# Patient Record
Sex: Female | Born: 1970 | Race: White | Hispanic: No | Marital: Married | Smoking: Former smoker
Health system: Southern US, Community
[De-identification: ages and names within clinical notes are randomized; demographics above are authoritative.]

## PROBLEM LIST (undated history)

## (undated) DIAGNOSIS — O1490 Unspecified pre-eclampsia, unspecified trimester: Secondary | ICD-10-CM

## (undated) DIAGNOSIS — K219 Gastro-esophageal reflux disease without esophagitis: Secondary | ICD-10-CM

## (undated) DIAGNOSIS — F53 Postpartum depression: Secondary | ICD-10-CM

## (undated) DIAGNOSIS — O09299 Supervision of pregnancy with other poor reproductive or obstetric history, unspecified trimester: Secondary | ICD-10-CM

## (undated) DIAGNOSIS — I10 Essential (primary) hypertension: Secondary | ICD-10-CM

## (undated) DIAGNOSIS — IMO0002 Reserved for concepts with insufficient information to code with codable children: Secondary | ICD-10-CM

## (undated) DIAGNOSIS — F99 Mental disorder, not otherwise specified: Secondary | ICD-10-CM

## (undated) DIAGNOSIS — D649 Anemia, unspecified: Secondary | ICD-10-CM

## (undated) DIAGNOSIS — Z331 Pregnant state, incidental: Secondary | ICD-10-CM

## (undated) HISTORY — PX: NO PAST SURGERIES: SHX2092

## (undated) HISTORY — DX: Anemia, unspecified: D64.9

---

## 2010-10-12 NOTE — L&D Delivery Note (Signed)
Delivery Information for [facility]    Labor Events:    Preterm labor: Yes   Cervical ripening:                    Rupture date: 08/29/2011   Rupture time: 8:30 AM   Rupture type: Spontaneous   Fluid Color: Clear   Fluid Odor Normal       Risk Factors:                                     LOW PAPP-A       Delivery Complications:  Abruption                                   during delivery          Vaginal Delivery Counts    Initial Count Correct?:  Yes   Next Count Correct?:      Relief Count Correct?:      Other Count Correct?:     Final Count Correct?:  Yes       Presentation/Position     Vertex   Left   Occiput   Anterior          Delivery (Newborn)    FHR in O.R.:     Delivery Rm Temp:     Date of birth: 08/29/2010   Time of birth: 10:18 PM   Sex: female   GA: Gestational Age: <None>   Delivery Type: Vaginal, Spontaneous Delivery                        VBAC Attempted:No    VBAC Successful:No   Delivery Assist by:     Number of pulls:     Vacuum Type:     Forceps Type:        Delivery Maneuvers    Non-Dystocia Maneuver:                          :         Delivery (Maternal)    Episiotomy: None   Lacerations: Yes   Laceration Type: 1st    Episiotomy/Laceration Repair: None   Blood Loss (mL): 300   Uterus Explored: No   Anesthesia Method: Local;Epidural            Newborn Assessment    Living at birth: Yes          APGARS  One minute Five minutes Ten minutes   Skin color: 0   1       Heart rate: 2   2       Grimace: 2   2       Muscle tone: 1   1       Breathing: 2   2       Totals: 7  8              Resuscitation    Resuscitation: Tactile Stimulation  Oxygen via mask/blow-by        Suction: Bulb  Gastric   Prior to DEL of chest: Yes       Newborn Measurements    Weight: 5 lb 11.9 oz (2605 g)   Length: 19"   Observed Anomalies:  Cord Information    Vessels: 3 vessels   Complications: Nuchal   Nuchal Cord: 1x   Cut before DEL of shoulder:     Blood gases sent? No   Cord Tissue Sample: No       Placenta  Information    Placenta Removal: Spontaneous     Placenta Appearance: Intact       Culture/Pathology    Cultures obtained: None        Pathology Specimens: Placenta

## 2011-03-05 ENCOUNTER — Ambulatory Visit: Admit: 2011-03-05 | Discharge: 2011-03-05 | Payer: Self-pay | Source: Ambulatory Visit

## 2011-06-11 ENCOUNTER — Ambulatory Visit
Admission: RE | Admit: 2011-06-11 | Disposition: A | Discharge status: Home / Self Care | Payer: Self-pay | Source: Ambulatory Visit

## 2011-07-09 ENCOUNTER — Ambulatory Visit
Admission: RE | Admit: 2011-07-09 | Disposition: A | Discharge status: Home / Self Care | Payer: Self-pay | Source: Ambulatory Visit

## 2011-07-15 NOTE — Consults (Signed)
Makayla Sullivan, Makayla Sullivan                                                    MRN:          [id number]                                                          Account:      [id number]                                                     Document ID:  [id number]                                               Service Date: 06/11/2011                                                                                    MRN: [id number]  Document ID: [id number]  Admit Date: 06/11/2011     Patient Location: DISCHARGED 06/11/2011  Patient Type: O     CONSULTING PHYSICIAN: Manuela Schwartz MD     REFERRING PHYSICIAN:         NOTE:  The [facility].     HISTORY OF PRESENT ILLNESS:   On the surface, the patient looks like she has multiple problems; however,  they do not justify the kind of anxiety she presented with today.       Her issues are as follows:  1.  Possible hypertension.  Apparently she was hypertensive and was on  Procardia-XL 30 mg a day.  She lost weight and her Procardia-XL was  discontinued by 8 weeks of her pregnancy for normal blood pressure.  Her  liver results were normal and she denies any symptoms associated with  hypertension.  At this point in her pregnancy, her blood pressures are  running normally.  She is taking them at home and today I have given her a  sheet to record her blood pressures and her weight and check her urine  protein on a daily basis for the development of preeclampsia.  She does  have an increased risk of preeclampsia and I have informed her of that and  I have told her that the best way for [place] to deal with it is to monitor it  and identify it when it happens.  She had her uterine artery Dopplers at 12  weeks and they were normal.  I am not going to repeat those now; the  initial ones have been normal.  2.  This patient is advanced maternal age; she is 40.  She had a first  trimester screen that showed her risk for Down syndrome to be in the 1 in  450, which is  equivalent to a 39 year old patient, it was increased to 1 in  350 approximately because of the presence of pyelectasis on the ultrasound  of her baby, which is still a risk of a 39 year old woman.  She was given  the option for either amniocentesis or MaterniT21 and she did that and the  MaterniT21 came back normal, which has a 99%.  This is specifically a  specificity and sensitivity.  At this point, I think we have put this issue  to rest.  3.  Her pyelectasis is stable.  It is associated with the gender of the  baby being female and at this point again, I have made it very clear to her 40  that the chances that this will progress into any significant pathology is                                                                                                           Page 1 of 2  Makayla Sullivan, Makayla Sullivan                                                    MRN:          [id number]                                                          Account:      [id number]                                                     Document ID:  [id number]                                               Service Date: 06/11/2011  very minimal and again, we need to relax about it.  If it is below 6 or 7,  it is usually of no consequence; however, it can be associated with   increasing the risk of Down syndrome.  When we adjusted for that after her  nuchal translucency the risk was still at 1 in about 350.  4.  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  This is  associated with an increased risk of growth restriction and/or premature  delivery; however, most of the patients with low  PAPP-A end up with a  normal, healthy pregnancy and I have told the patient that.     PLAN:  My plan for her is to have her come back in about 4 weeks for an EFW.   Three weeks after that, we will repeat her EFW.  At 36 weeks, we will start  her on nonstress test and AFI  on a weekly basis and I would recommend she  be delivered by 39 weeks because of the multiple issues and her maternal  age.  If the baby does not exhibit growth restriction, then the risk of  complications from the PAPP-A are going to be lower.  The patient  understands.              D:  06/11/2011 14:54 PM by Manuela Schwartz, MD [phone])  T:  06/12/2011 10:41 AM by MD      (Conf: [phone]) (Doc ID: [id number])        cc: Alver Sorrow MD                                                                                                           Page 2 of 2

## 2011-07-20 ENCOUNTER — Ambulatory Visit (HOSPITAL_BASED_OUTPATIENT_CLINIC_OR_DEPARTMENT_OTHER)
Admit: 2011-07-20 | Discharge: 2011-07-20 | Disposition: A | Discharge status: Home / Self Care | Payer: Self-pay | Source: Ambulatory Visit

## 2011-08-04 ENCOUNTER — Encounter (HOSPITAL_BASED_OUTPATIENT_CLINIC_OR_DEPARTMENT_OTHER): Payer: Self-pay | Admitting: Specialist

## 2011-08-12 LAB — BASIC METABOLIC PANEL
BUN: 7 mg/dL — ABNORMAL LOW (ref 8–20)
CO2: 21 mEq/L (ref 21–30)
Calcium: 8.8 mg/dL (ref 8.6–10.2)
Chloride: 104 mEq/L (ref 98–107)
Creatinine: 0.5 mg/dL — ABNORMAL LOW (ref 0.6–1.5)
Glucose: 82 mg/dL (ref 70–100)
Potassium: 4.3 mEq/L (ref 3.6–5.0)
Sodium: 136 mEq/L (ref 136–146)

## 2011-08-12 LAB — CBC
Hematocrit: 38.1 % (ref 37.0–47.0)
Hgb: 13.1 g/dL (ref 12.0–16.0)
MCH: 28.7 pg (ref 28.0–32.0)
MCHC: 34.4 g/dL (ref 32.0–36.0)
MCV: 83.6 fL (ref 80.0–100.0)
MPV: 12.4 fL — ABNORMAL HIGH (ref 9.4–12.3)
Nucleated RBC: 0 /100 WBC
Platelets: 244 10*3/uL (ref 140–400)
RBC: 4.56 10*6/uL (ref 4.20–5.40)
RDW: 14 % (ref 12–15)
WBC: 9.93 10*3/uL (ref 3.50–10.80)

## 2011-08-12 LAB — ALT: ALT: 30 U/L (ref 3–36)

## 2011-08-12 LAB — URIC ACID: Uric acid: 3.8 mg/dL (ref 2.3–6.1)

## 2011-08-12 LAB — AST: AST (SGOT): 25 U/L (ref 10–41)

## 2011-08-12 LAB — LACTATE DEHYDROGENASE: LDH: 555 U/L (ref 307–575)

## 2011-08-12 LAB — GFR: EGFR: >60

## 2011-08-20 ENCOUNTER — Inpatient Hospital Stay
Admission: RE | Admit: 2011-08-20 | Discharge: 2011-08-20 | Disposition: A | Discharge status: Home / Self Care | Payer: BLUE CROSS/BLUE SHIELD | Source: Ambulatory Visit | Attending: Specialist | Admitting: Specialist

## 2011-08-20 DIAGNOSIS — O09519 Supervision of elderly primigravida, unspecified trimester: Secondary | ICD-10-CM | POA: Insufficient documentation

## 2011-08-29 ENCOUNTER — Inpatient Hospital Stay (HOSPITAL_BASED_OUTPATIENT_CLINIC_OR_DEPARTMENT_OTHER): Payer: BLUE CROSS/BLUE SHIELD | Admitting: Specialist

## 2011-08-29 ENCOUNTER — Encounter (HOSPITAL_BASED_OUTPATIENT_CLINIC_OR_DEPARTMENT_OTHER): Payer: Self-pay | Admitting: Anesthesiology

## 2011-08-29 ENCOUNTER — Encounter (HOSPITAL_BASED_OUTPATIENT_CLINIC_OR_DEPARTMENT_OTHER): Payer: Self-pay | Admitting: Specialist

## 2011-08-29 ENCOUNTER — Inpatient Hospital Stay
Admission: RE | Admit: 2011-08-29 | Discharge: 2011-08-31 | DRG: 774 | Disposition: A | Discharge status: Home / Self Care | Payer: BLUE CROSS/BLUE SHIELD | Source: Ambulatory Visit | Attending: Obstetrics | Admitting: Obstetrics

## 2011-08-29 DIAGNOSIS — G4700 Insomnia, unspecified: Secondary | ICD-10-CM | POA: Diagnosis present

## 2011-08-29 DIAGNOSIS — Z23 Encounter for immunization: Secondary | ICD-10-CM

## 2011-08-29 DIAGNOSIS — O99892 Other specified diseases and conditions complicating childbirth: Secondary | ICD-10-CM | POA: Diagnosis present

## 2011-08-29 DIAGNOSIS — O1002 Pre-existing essential hypertension complicating childbirth: Secondary | ICD-10-CM | POA: Diagnosis present

## 2011-08-29 DIAGNOSIS — E8809 Other disorders of plasma-protein metabolism, not elsewhere classified: Secondary | ICD-10-CM | POA: Diagnosis present

## 2011-08-29 DIAGNOSIS — F411 Generalized anxiety disorder: Secondary | ICD-10-CM | POA: Diagnosis present

## 2011-08-29 DIAGNOSIS — O09529 Supervision of elderly multigravida, unspecified trimester: Secondary | ICD-10-CM | POA: Diagnosis present

## 2011-08-29 HISTORY — DX: Mental disorder, not otherwise specified: F99

## 2011-08-29 HISTORY — DX: Essential (primary) hypertension: I10

## 2011-08-29 HISTORY — DX: Reserved for concepts with insufficient information to code with codable children: IMO0002

## 2011-08-29 LAB — CBC
Hematocrit: 38.7 % (ref 37.0–47.0)
Hgb: 13.2 g/dL (ref 12.0–16.0)
MCH: 28.2 pg (ref 28.0–32.0)
MCHC: 34.1 g/dL (ref 32.0–36.0)
MCV: 82.7 fL (ref 80.0–100.0)
MPV: 13 fL — ABNORMAL HIGH (ref 9.4–12.3)
Nucleated RBC: 0 /100 WBC
Platelets: 212 10*3/uL (ref 140–400)
RBC: 4.68 10*6/uL (ref 4.20–5.40)
RDW: 13 % (ref 12–15)
WBC: 12.3 10*3/uL — ABNORMAL HIGH (ref 3.50–10.80)

## 2011-08-29 LAB — GONOCOCCUS CULTURE
Chlamydia trachomatis Culture: NEGATIVE
Culture Gonorrhoeae: NEGATIVE

## 2011-08-29 LAB — ALT: ALT: 30 U/L (ref 3–36)

## 2011-08-29 LAB — GFR: EGFR: >60

## 2011-08-29 LAB — TYPE AND SCREEN
AB Screen Gel: NEGATIVE
ABO Rh: A POS

## 2011-08-29 LAB — HEPATITIS B SURFACE ANTIGEN W/ REFLEX TO CONFIRMATION: Hepatitis B Surface Antigen: NEGATIVE

## 2011-08-29 LAB — LACTATE DEHYDROGENASE: LDH: 942 U/L — ABNORMAL HIGH (ref 307–575)

## 2011-08-29 LAB — HIV AG/AB 4TH GENERATION: HIV 1/2 Antibody: NONREACTIVE

## 2011-08-29 LAB — URIC ACID: Uric acid: 4.1 mg/dL (ref 2.3–6.1)

## 2011-08-29 LAB — CREATININE, SERUM: Creatinine: 0.5 mg/dL — ABNORMAL LOW (ref 0.6–1.5)

## 2011-08-29 LAB — RUBELLA ANTIBODY, IGG: Rubella AB, IgG: IMMUNE

## 2011-08-29 LAB — AST: AST (SGOT): 30 U/L (ref 10–41)

## 2011-08-29 LAB — ABO/RH: ABO Rh: A POS

## 2011-08-29 MED ORDER — TERBUTALINE SULFATE 1 MG/ML IJ SOLN
0.2500 mg | Freq: Once | INTRAMUSCULAR | Status: DC | PRN
Start: 2011-08-29 — End: 2011-08-29

## 2011-08-29 MED ORDER — DEXTROSE IN LACTATED RINGERS 5 % IV SOLN
INTRAVENOUS | Status: DC
Start: 2011-08-29 — End: 2011-08-31

## 2011-08-29 MED ORDER — BUPIVACAINE HCL (PF) 0.25 % IJ SOLN
INTRAMUSCULAR | Status: DC | PRN
Start: 2011-08-29 — End: 2013-07-27
  Administered 2011-08-29: 7 mL via EPIDURAL

## 2011-08-29 MED ORDER — PENICILLIN G POTASSIUM 5000000 UNITS IJ SOLR
5.00 10*6.[IU] | Freq: Once | INTRAMUSCULAR | Status: AC
Start: 2011-08-29 — End: 2011-08-29
  Administered 2011-08-29: 5 10*6.[IU] via INTRAVENOUS
  Filled 2011-08-29: qty 5

## 2011-08-29 MED ORDER — FENTANYL 2 MCG/ML+BUPIVACAINE 0.125% 100 ML
EPIDURAL | Status: AC
Start: 2011-08-29 — End: 2011-08-30
  Filled 2011-08-29: qty 100

## 2011-08-29 MED ORDER — ONDANSETRON HCL 4 MG/2ML IJ SOLN
4.00 mg | Freq: Three times a day (TID) | INTRAMUSCULAR | Status: DC | PRN
Start: 2011-08-29 — End: 2011-08-31
  Administered 2011-08-29 (×2): 4 mg via INTRAVENOUS
  Filled 2011-08-29: qty 1

## 2011-08-29 MED ORDER — SODIUM CHLORIDE 0.9 % IV SOLN
2.50 10*6.[IU] | INTRAVENOUS | Status: DC
Start: 2011-08-29 — End: 2011-08-29
  Administered 2011-08-29 (×2): 2.5 10*6.[IU] via INTRAVENOUS

## 2011-08-29 MED ORDER — OXYTOCIN 30 UNITS IN LR 500 ML LABOR -OUTSOURCED
2.0000 m[IU]/min | INTRAVENOUS | Status: DC
Start: 2011-08-29 — End: 2011-08-29
  Administered 2011-08-29: 2 m[IU]/min via INTRAVENOUS
  Filled 2011-08-29: qty 500

## 2011-08-29 MED ORDER — FENTANYL CITRATE 0.05 MG/ML IJ SOLN
INTRAMUSCULAR | Status: AC
Start: 2011-08-29 — End: 2011-08-30
  Filled 2011-08-29: qty 2

## 2011-08-29 MED ORDER — FENTANYL CITRATE 0.05 MG/ML IJ SOLN
INTRAMUSCULAR | Status: DC | PRN
Start: 2011-08-29 — End: 2013-07-27
  Administered 2011-08-29: 10 mL via EPIDURAL

## 2011-08-29 MED ORDER — PENICILLIN G POTASSIUM 5000000 UNITS IJ SOLR
2.50 10*6.[IU] | INTRAMUSCULAR | Status: DC
Start: 2011-08-29 — End: 2011-08-29
  Filled 2011-08-29 (×6): qty 2.5

## 2011-08-29 MED ORDER — LACTATED RINGERS IV SOLN
INTRAVENOUS | Status: DC
Start: 2011-08-29 — End: 2011-08-29

## 2011-08-29 NOTE — Anesthesia Procedure Notes (Signed)
Epidural    Patient location during procedure: L&D  Staffing  Anesthesiologist: Oneta Rack  Performed by: anesthesiologist   Preanesthetic Checklist  Completed: patient identified, surgical consent, pre-op evaluation, timeout performed, risks and benefits discussed, monitors and equipment checked and anesthesia consent given  Epidural  Patient monitoring: NIBP    Patient position: sitting  Prep: Betadine, site prepped and draped and mask used  Local infiltration: bupivicaine 0.25%  Location: L3-4  Approach: midline  Injection technique: LOR air  CSF Return: No  Blood Return: No  Needle  Needle gauge: 17  Catheter type: end hole  Catheter size: 19 G  Test dose: negative  Assessment  Slow fractionated injection: yes  Injection made incrementally with aspirations every 3 mL.  CSF Aspirated: No  Blood Aspirated: no  Paresthesia Pain: no  Sensory level: T4  Block Outcome: patient tolerated procedure well, no complications and successful block

## 2011-08-29 NOTE — Progress Notes (Signed)
Pt. ADMITTED FOR LABOR INDUCTION TO ROOM 308.

## 2011-08-29 NOTE — Progress Notes (Signed)
Toco: ctxn q  EFM: Cat I  VE: 3-4 cm/90%/-2  Continue  oxytocin

## 2011-08-29 NOTE — Progress Notes (Signed)
No complaints  Toco - ctxn Q4-  EFM occasional variable decels, +accels, moderate variability, Cat II  VE1cm/70%/-2  Plan Continue oxytocin

## 2011-08-29 NOTE — H&P (Signed)
ADMISSION HISTORY AND PHYSICAL EXAM    Date Time: 08/29/2011 4:19 PM  Patient Name: Makayla Sullivan  Attending Physician: Manuela Schwartz, MD  :  CC: Leakage of fluid starting at 8:30 Am today    History of Presenting Illness:   Patient is a 40 y.o., @ [redacted]w[redacted]d with c/o leaking, occasional painful ctxn   Antepartum significant for : AMA, Low PAPP-A, B/L fetal hydronephrosis  OB History     Grav Para Term Preterm Abortions TAB SAB Ect Mult Living    2      0              Past Medical History:      Past Medical History   Diagnosis Date   . Hypertension    . Infertility        Past Surgical History:       OB History:     OB History     Grav Para Term Preterm Abortions TAB SAB Ect Mult Living    2      0          # Outc Date GA Lbr Len/2nd Wgt Sex Del Anes PTL Lv    1 GRA             2 CUR                 No LMP recorded. Patient is pregnant.      Medications / Herbals / OTC:     Prescriptions prior to admission   Medication Sig   . aspirin 81 MG tablet Take 81 mg by mouth daily.     . Budesonide (RHINOCORT AQUA NA) by Nasal route daily.     . calcium carbonate (OS-CAL) 600 MG TABS tablet Take 600 mg by mouth daily.     Marland Kitchen escitalopram (LEXAPRO) 5 MG tablet Take 5 mg by mouth daily.     . pantoprazole (PROTONIX) 20 MG tablet Take 20 mg by mouth daily.     . Prenatal Vit-Fe Fumarate-FA (PRENATAL MULTIVITAMIN) 60-1 MG tablet Take 1 tablet by mouth daily.           Allergies:   No Known Allergies      Psychosocial / Family History:     History     Social History   . Marital Status: Married     Spouse Name: N/A     Number of Children: N/A   . Years of Education: N/A     Social History Main Topics   . Smoking status: Not on file   . Smokeless tobacco: Not on file   . Alcohol Use: No   . Drug Use: No   . Sexually Active: Not Currently     Other Topics Concern   . Not on file     Social History Narrative   . No narrative on file       No family history on file.    Physical Exam:     Filed Vitals:    08/29/11 1610   BP:    Pulse:     Temp:    Resp: 20       General appearance - alert, well appearing, and in no distress and oriented to person, place, and time  Mental status - alert, oriented to person, place, and time, normal mood, behavior, speech, dress  Chest - clear to auscultation, no wheezes, rales or rhonchi, symmetric air entry, no tachypnea, retractions or cyanosis  Heart -  normal rate, regular rhythm, normal S1, S2, no murmurs, rubs, clicks or gallops  Abdomen - Gravid,non tender  Breasts - not examined  Pelvic - Pooling on SSE, closed/long cervix on VE (no Ferning, neg nitrazine)  Rectal - rectal exam not indicated  Back exam - not examined  Extremities - feet normal, good pulses, normal color, temperature and sensation, no edema, redness or tenderness in the calves or thighs, Homan's sign negative bilaterally  Skin - normal coloration and turgor, no rashes, no suspicious skin lesions noted  Sono: Vtx, AFI 9 cm  Toco: Irreg, EFM Cat I   Assessment:   Patient is a 70 y.o., @ [redacted]w[redacted]d with PROM, Chronic HTN(no meds), Low PAPP-A,  FETAL B/L HYDRONEPROSIS  OB History     Grav Para Term Preterm Abortions TAB SAB Ect Mult Living    2      0             Plan:   Induction of labour, will give PCN for unknown GBS status and Prematurity  HELLP labs sent secondary to elevated BP on admission

## 2011-08-30 ENCOUNTER — Encounter (HOSPITAL_BASED_OUTPATIENT_CLINIC_OR_DEPARTMENT_OTHER): Payer: Self-pay | Admitting: Specialist

## 2011-08-30 LAB — CBC
Hematocrit: 26.7 % — ABNORMAL LOW (ref 37.0–47.0)
Hgb: 9.2 g/dL — ABNORMAL LOW (ref 12.0–16.0)
MCH: 28.7 pg (ref 28.0–32.0)
MCHC: 34.5 g/dL (ref 32.0–36.0)
MCV: 83.2 fL (ref 80.0–100.0)
MPV: 12.4 fL — ABNORMAL HIGH (ref 9.4–12.3)
Nucleated RBC: 0 /100 WBC
Platelets: 210 10*3/uL (ref 140–400)
RBC: 3.21 10*6/uL — ABNORMAL LOW (ref 4.20–5.40)
RDW: 14 % (ref 12–15)
WBC: 18.94 10*3/uL — ABNORMAL HIGH (ref 3.50–10.80)

## 2011-08-30 MED ORDER — LIDOCAINE HCL 2 % IJ SOLN
5.0000 mL | Freq: Once | INTRAMUSCULAR | Status: DC
Start: 2011-08-30 — End: 2011-08-31
  Filled 2011-08-30: qty 5

## 2011-08-30 MED ORDER — BENZOCAINE-MENTHOL 20-0.5 % EX AERO
1.0000 | INHALATION_SPRAY | CUTANEOUS | Status: DC | PRN
Start: 2011-08-30 — End: 2011-08-31
  Administered 2011-08-30: 1 via TOPICAL
  Filled 2011-08-30: qty 56

## 2011-08-30 MED ORDER — OXYTOCIN 30UNITS IN LR 500 ML PP--OUTSOURCED
7.5000 [IU]/h | INTRAVENOUS | Status: DC
Start: 2011-08-30 — End: 2011-08-31

## 2011-08-30 MED ORDER — WITCH HAZEL-GLYCERIN EX PADS
1.0000 | MEDICATED_PAD | CUTANEOUS | Status: DC | PRN
Start: 2011-08-30 — End: 2011-08-31
  Administered 2011-08-30: 1 via TOPICAL
  Filled 2011-08-30 (×3): qty 40

## 2011-08-30 MED ORDER — PANTOPRAZOLE SODIUM 40 MG PO TBEC
40.00 mg | DELAYED_RELEASE_TABLET | Freq: Every morning | ORAL | Status: DC
Start: 2011-08-30 — End: 2011-08-31
  Administered 2011-08-30: 40 mg via ORAL
  Filled 2011-08-30 (×2): qty 1

## 2011-08-30 MED ORDER — ONDANSETRON HCL 4 MG/2ML IJ SOLN
8.0000 mg | Freq: Three times a day (TID) | INTRAMUSCULAR | Status: DC | PRN
Start: 2011-08-30 — End: 2011-08-31

## 2011-08-30 MED ORDER — MORPHINE SULFATE 2 MG/ML IJ SOLN
5.0000 mg | Freq: Once | INTRAMUSCULAR | Status: DC | PRN
Start: 2011-08-30 — End: 2011-08-31

## 2011-08-30 MED ORDER — ESCITALOPRAM OXALATE 10 MG PO TABS
5.00 mg | ORAL_TABLET | Freq: Every day | ORAL | Status: DC
Start: 2011-08-30 — End: 2011-08-31
  Administered 2011-08-30: 5 mg via ORAL
  Filled 2011-08-30 (×2): qty 1

## 2011-08-30 MED ORDER — PRENATAL AD PO TABS
1.0000 | ORAL_TABLET | Freq: Every day | ORAL | Status: DC
Start: 2011-08-30 — End: 2011-08-31
  Filled 2011-08-30 (×2): qty 1

## 2011-08-30 MED ORDER — OXYCODONE-ACETAMINOPHEN 5-325 MG PO TABS
2.0000 | ORAL_TABLET | Freq: Once | ORAL | Status: DC | PRN
Start: 2011-08-30 — End: 2011-08-31
  Filled 2011-08-30: qty 1

## 2011-08-30 MED ORDER — IBUPROFEN 600 MG PO TABS
600.00 mg | ORAL_TABLET | Freq: Four times a day (QID) | ORAL | Status: DC | PRN
Start: 2011-08-30 — End: 2011-08-31
  Administered 2011-08-30 – 2011-08-31 (×5): 600 mg via ORAL
  Filled 2011-08-30 (×4): qty 1

## 2011-08-30 MED ORDER — METHYLERGONOVINE MALEATE 0.2 MG PO TABS
0.2000 mg | ORAL_TABLET | Freq: Four times a day (QID) | ORAL | Status: AC | PRN
Start: 2011-08-30 — End: 2011-08-31

## 2011-08-30 MED ORDER — HYDROCORTISONE 1 % EX OINT
TOPICAL_OINTMENT | Freq: Three times a day (TID) | CUTANEOUS | Status: DC | PRN
Start: 2011-08-30 — End: 2011-08-31

## 2011-08-30 MED ORDER — IBUPROFEN 200 MG PO TABS
600.0000 mg | ORAL_TABLET | Freq: Once | ORAL | Status: AC | PRN
Start: 2011-08-30 — End: 2011-08-30
  Administered 2011-08-30: 600 mg via ORAL

## 2011-08-30 NOTE — Progress Notes (Signed)
Informed dr. Estill Batten about blood pressure during recovery.  Pt says that when she is excited she gets those kind of pressure.  Will continue on monitoring at this time.   Last pressure 127/80.  MD also informed of pt's moderate bleeding. However, fundus is firm on massage.

## 2011-08-30 NOTE — Progress Notes (Signed)
Subjective:  Patient doing well, nreporting mild cramping. Voiding, ambulating tolerating PO intake. Heavy Lochia     Objective:   Filed Vitals:    08/30/11 0730   BP: 132/89   Pulse: 89   Temp: 98.3 F (36.8 C)   Resp: 17     General: NAD, AAO X 3  Abdomen: Soft, nontender, nondistended, fundus firm 1 fingerbreadth below the umbilicus.  Extremeties: No tenderness    Results     Procedure Component Value Units Date/Time    Creatinine, serum [[id number]]  (Abnormal) Collected:08/29/11 1230     Creatinine 0.5 (L) mg/dL UUVOZDG:64/40/34 7425    AST [[id number]] Collected:08/29/11 1230     AST (SGOT) 30 U/L Updated:08/29/11 1557    ALT [[id number]] Collected:08/29/11 1230     ALT 30 U/L Updated:08/29/11 1557    Uric acid [[id number]] Collected:08/29/11 1230     Uric Acid 4.1 mg/dL WFUXNAT:55/73/22 0254    Lactate dehydrogenase [[id number]]  (Abnormal) Collected:08/29/11 1230     LDH 942 (H) U/L Updated:08/29/11 1557    GFR [[id number]] Collected:08/29/11 1230     GFR Caucasian >60   Updated:08/29/11 1557    CBC without differential [[id number]]  (Abnormal) Collected:08/29/11 1230     White Blood Cells 12.30 (H) x10 3/uL Updated:08/29/11 1550     RBC 4.68 x10 6/uL      Hgb 13.2 g/dL      Hematocrit 94.8 %      MCV 82.7 fL      MCH 28.2 pg      MCHC 34.1 g/dL      RDW 13 %      Platelets 212 x10 3/uL      MPV 13.0 (H) fL      Nucleated RBC . 0 /100 WBC     Type and Screen [[id number]] Collected:08/29/11 1232     ABORH A POS Updated:08/29/11 1459     Antibody Screen Gel NEG     GROUP B STREP INTRAPARTUM PCR [[id number]] Collected:08/29/11 1209    Specimen Information:Vaginal/Anal Swabs Updated:08/29/11 1328    Narrative:    Culture Group B Strep was cancelled on 08/29/11 at 12:17 by [id number]; Modified  Order 08/29/2011  12:17  ORDER#: [id number]                                    ORDERED BY: Gwen Edler, JEA  SOURCE: Vaginal/Anal Swabs                           COLLECTED:  08/29/11 12:09  ANTIBIOTICS AT COLL.:                                 RECEIVED :  08/29/11 12:17  Culture Group B Strep was cancelled on 08/29/11 at 12:17 by [id number]; Modified Order 08/29/2011  12:17  Group B Strep Intrapartum PCR              FINAL       08/29/11 13:25  08/29/11   Negative: Group B Strep target nucleic acid not detected                       Presumed not colonized with GBS             Test performed by [employer]  Gonococcus culture [[id number]] Resulted:08/29/11     Specimen Information:Other Updated:08/29/11 1321     Culture Gonorrhoeae Negative      Chlamydia trachomatis Culture Negative     ABO/Rh [[id number]] Resulted:08/29/11     Specimen Information:Blood Updated:08/29/11 1321     ABORH A Pos     Hepatitis B (HBV) Surface Antigen [[id number]] Resulted:08/29/11     Specimen Information:Blood Updated:08/29/11 1321     Hepatitis B Surface AG Negative     Rubella antibody, IgG [[id number]] Resulted:08/29/11     Specimen Information:Blood Updated:08/29/11 1321     Rubella Ab, IgG Immune     Anti-HIV Antibody [[id number]] Resulted:08/29/11      HIV 1/2 AB Nonreactive Updated:08/29/11 1321          Assessment:  Post partum day # 1, heavy lochia    Plan: will check CBC.    Protonix(40 mg) and Lexapro(5mg ) ordered. Patient will take her owns meds.

## 2011-08-30 NOTE — Progress Notes (Signed)
Notified Dr. Estill Batten of afternoon CBC results.  No new orders given at this time.

## 2011-08-30 NOTE — Progress Notes (Signed)
Patient screened Negative for Beginning Steps for Parents Program.    Makayla Sullivan

## 2011-08-30 NOTE — Consults (Addendum)
Breastfeeding basics reviewed. Mo receptive to instructions given. Contact # given for pt., to call for assistance as needed.

## 2011-08-30 NOTE — Progress Notes (Signed)
Noted more moderate bleeding.  OB resident Dr. Harland Dingwall called to evaluate.  Cervical exam done and clots evacuated approx 250 cc.  600 mcg cytotec inserted per rectum.

## 2011-08-30 NOTE — Consults (Signed)
Assisted MOB to bring infant to breast. Infant latched on in football hold. Latched maintained. Suckling on and off with stimulation.

## 2011-08-30 NOTE — Progress Notes (Signed)
Called to evaluate patient with pp bleeding greater than expected. Chux with large area of blood. FF U+1. clot evacuated from LUS. cytotec placed PR.     Jesus Genera, PGY2

## 2011-08-31 ENCOUNTER — Ambulatory Visit: Payer: Self-pay

## 2011-08-31 MED ORDER — TETANUS-DIPHTH-ACELL PERTUSSIS 5-2-15.5 LF-MCG/0.5 IM SUSP
0.50 mL | Freq: Once | INTRAMUSCULAR | Status: DC
Start: 2011-08-31 — End: 2011-08-31

## 2011-08-31 NOTE — Progress Notes (Signed)
Subjective:  Patient doing well, no complaints. Voiding, ambulating tolerating PO intake. Lochia decreasing, pain controlled. Pumping well.      Objective:   Filed Vitals:    08/31/11 0356   BP: 128/85   Pulse: 80   Temp: 98.1 F (36.7 C)   Resp: 18     General: NAD, AAO X 3  Heart: RRR, Normal S1, S2, no murmurs  Lungs:CTAB  Abdomen: Soft, nontender, nondistended, fundus firm 2 fingerbreadth above umbilicus.  Extremeties: No edema, no homan's sign    Results     ** No Results found for the last 24 hours. **          Assessment:  Post delivery day # 2    Plan: will plan discharge home today            Care sheet reviewed at bedside.            followup office visit

## 2011-08-31 NOTE — Procedures (Signed)
Makayla Sullivan is a 40 y.o. female patient.  Active Problems:   * No active hospital problems. *     Past Medical History   Diagnosis Date   . Infertility    . Hypertension      During Pregnancy   . Mental disorder      Depression on Lexapro     Blood pressure 128/85, pulse 80, temperature 98.1 F (36.7 C), temperature source Oral, resp. rate 18, height 1.753 m (5\' 9" ), weight 72.122 kg (159 lb), currently breastfeeding.    Procedures    Atilano Median  08/31/2011

## 2011-08-31 NOTE — Discharge Summary (Signed)
See Progress note dated 11/19

## 2011-08-31 NOTE — Discharge Instructions (Signed)
Care sheet given and reviewed.

## 2011-08-31 NOTE — Consults (Signed)
Worked with parents on positioning/latch, plan for pumping and supplementing after nursing.  To confirm amounts of supplement needed with Pedi.

## 2011-09-02 ENCOUNTER — Ambulatory Visit
Admission: RE | Admit: 2011-09-02 | Discharge: 2011-09-02 | Disposition: A | Discharge status: Home / Self Care | Payer: BLUE CROSS/BLUE SHIELD | Source: Ambulatory Visit | Attending: Specialist | Admitting: Specialist

## 2011-09-02 ENCOUNTER — Ambulatory Visit (HOSPITAL_BASED_OUTPATIENT_CLINIC_OR_DEPARTMENT_OTHER)
Admission: RE | Admit: 2011-09-02 | Discharge: 2011-09-02 | Disposition: A | Discharge status: Home / Self Care | Payer: BLUE CROSS/BLUE SHIELD | Source: Ambulatory Visit

## 2011-09-02 DIAGNOSIS — O09529 Supervision of elderly multigravida, unspecified trimester: Secondary | ICD-10-CM | POA: Insufficient documentation

## 2011-09-02 LAB — CBC
Hematocrit: 23.8 % — ABNORMAL LOW (ref 37.0–47.0)
Hgb: 8 g/dL — ABNORMAL LOW (ref 12.0–16.0)
MCH: 28.5 pg (ref 28.0–32.0)
MCHC: 33.6 g/dL (ref 32.0–36.0)
MCV: 84.7 fL (ref 80.0–100.0)
MPV: 10.7 fL (ref 9.4–12.3)
Nucleated RBC: 0 /100 WBC
Platelets: 367 10*3/uL (ref 140–400)
RBC: 2.81 10*6/uL — ABNORMAL LOW (ref 4.20–5.40)
RDW: 14 % (ref 12–15)
WBC: 13.18 10*3/uL — ABNORMAL HIGH (ref 3.50–10.80)

## 2011-09-02 LAB — URIC ACID: Uric acid: 3.5 mg/dL (ref 2.3–6.1)

## 2011-09-02 LAB — LACTATE DEHYDROGENASE: LDH: 695 U/L — ABNORMAL HIGH (ref 307–575)

## 2011-09-02 LAB — POCT URINALYSIS DIPSTICK (5)
Glucose, UA POCT: NEGATIVE mg/dL
Ketones, UA POCT: NEGATIVE mg/dL
POCT pH, UA: 6.5 (ref 5–8)
Protein, UA POCT: NEGATIVE mg/dL

## 2011-09-02 LAB — ALT: ALT: 55 U/L — ABNORMAL HIGH (ref 3–36)

## 2011-09-02 LAB — GFR: EGFR: >60

## 2011-09-02 LAB — AST: AST (SGOT): 60 U/L — ABNORMAL HIGH (ref 10–41)

## 2011-09-02 LAB — CREATININE, SERUM: Creatinine: 0.5 mg/dL — ABNORMAL LOW (ref 0.6–1.5)

## 2011-09-02 NOTE — Progress Notes (Signed)
Subjective:  Sent from Office for R/o preeclampsia.  Patient doing well, no complaints. Voiding, ambulating tolerating PO intake. Lochia decreasing, pain controlled.    Objective:   BP: 145/86   General appearance - Anxious, alert, well appearing, and in no distress and oriented to person, place, and time   Mental status - alert, oriented to person, place, and time, normal mood, behavior, speech, dress, motor activity, and thought processes   Chest - clear to auscultation, no wheezes, rales or rhonchi, symmetric air entry, no tachypnea, retractions or cyanosis   Heart - Tachycardic, regular rhythm, normal S1, S2, no murmurs, rubs, clicks or gallops   Abdomen - soft, nontender, nondistended, no masses or organomegaly   no rebound tenderness noted   Breasts - not examined   Extremities - feet normal, good pulses, normal color, temperature and sensation, no edema, redness or tenderness in the calves or thighs, Homan's sign negative bilaterally, +3 reflexes bilaterally, negative clonus.  Skin - normal coloration and turgor, no rashes, no suspicious skin lesions noted    Assessment:  Post operative day # 4  Anemia  Anxiety  Possible CHTN  Insomnia ( prob 2ry to anxiety)    Plan:  Iron  Continue Lexapro  Procardia XL 30 daily  Home BP daily; call if SBP >150, DBP > 95  Ambien prn insomnia  Folow up at office in 1 week  Preeclampsia precautions given

## 2011-09-07 NOTE — Discharge Summary (Signed)
DATE OF ADMISSION: 08/29/2011  9:37 AM     DATE OF DISCHARGE: 09/07/2011     ADMISSION DIAGNOSIS: <principal problem not specified>    DISCHARGE DIAGNOSIS: Same, Delivered    DISCHARGE PHYSICIAN: Arna Snipe    DISCHARGE CONDITION: stable    CONSULTATIONS: Lactation    PROCEDURES: induced vaginal Epidural anesthesia    HOSPITAL COURSE : Makayla Sullivan is a 40 y.o. with PPROM at 36+wks. She was admitted to the hopsital for PPROM. Her antepartum course was complicated by hypertension on no meds and infertility.  Patient delivered  via  induced vaginal delivery with placental abruption diagnosed upon delivery. Her postpartum course was complicated by anemia. She was discharged home in a stable good condition.    DISCHARGE PHYSICAL EXAMINATION: Normal    MEDS: Motrin, Iron    FOLLOW-UP: in 6 weeks    DISCHARGE INSTRUCTIONS: Discharge instructions sheet given

## 2011-09-10 ENCOUNTER — Ambulatory Visit (HOSPITAL_BASED_OUTPATIENT_CLINIC_OR_DEPARTMENT_OTHER): Payer: Self-pay | Admitting: Specialist

## 2013-05-16 LAB — ANTIBODY SCREEN: AB Screen Gel: NEGATIVE

## 2013-05-16 LAB — HIV ANTIBODIES, HIV-1/2, EIA WITH REFLEXES

## 2013-05-16 LAB — ABO/RH
ABO Group: A POS
Rh Factor: POSITIVE

## 2013-05-16 LAB — HEMOGLOBIN AND HEMATOCRIT, BLOOD
Hematocrit: 38.8 % (ref 36.0–46.0)
Hgb: 12.4 (ref 11.5–16.0)

## 2013-05-16 LAB — HEPATITIS B SURFACE ANTIGEN W/ REFLEX TO CONFIRMATION: Hepatitis B Surface Antigen: NEGATIVE

## 2013-05-16 LAB — RUBELLA ANTIBODY, IGG: Rubella AB, IgG: IMMUNE

## 2013-05-16 LAB — VARICELLA ZOSTER ANTIBODY, IGG: Varicella, IgG: IMMUNE

## 2013-05-16 LAB — RPR: RPR: NONREACTIVE

## 2013-05-16 LAB — MATERNIT21 PLUS CORE WITH GENDER: COMCON MATERNIT21 PLUS: NORMAL

## 2013-05-16 LAB — PLATELET COUNT: Platelets: 337 10*3/uL (ref 150–399)

## 2013-05-19 ENCOUNTER — Ambulatory Visit: Payer: Self-pay | Admitting: Specialist

## 2013-06-22 ENCOUNTER — Other Ambulatory Visit: Payer: Self-pay | Admitting: Obstetrics & Gynecology

## 2013-07-06 ENCOUNTER — Ambulatory Visit
Admission: RE | Admit: 2013-07-06 | Discharge: 2013-07-06 | Disposition: A | Discharge status: Home / Self Care | Payer: BLUE CROSS/BLUE SHIELD | Source: Ambulatory Visit | Attending: Obstetrics & Gynecology | Admitting: Obstetrics & Gynecology

## 2013-07-06 ENCOUNTER — Other Ambulatory Visit: Payer: Self-pay | Admitting: Specialist

## 2013-07-06 ENCOUNTER — Ambulatory Visit: Payer: Self-pay | Admitting: Specialist

## 2013-07-06 DIAGNOSIS — Z3480 Encounter for supervision of other normal pregnancy, unspecified trimester: Secondary | ICD-10-CM | POA: Insufficient documentation

## 2013-07-06 LAB — POCT URINALYSIS DIPSTIX (10)(MULTI-TEST)
Bilirubin, UA POCT: NEGATIVE
Glucose, UA POCT: NEGATIVE mg/dL
Ketones, UA POCT: NEGATIVE mg/dL
Nitrite, UA POCT: NEGATIVE
POCT Spec Gravity, UA: 1.01 (ref 1.001–1.035)
POCT pH, UA: 6 (ref 5–8)
Protein, UA POCT: 30 mg/dL — AB
Urobilinogen, UA: 0.2 mg/dL

## 2013-07-08 LAB — ALPHA FETOPROTEIN, MATERNAL
Alpha-fetoprotein Maternal Screen: 38.6 ng/mL
Gestational Age Based On:: 18.3 weeks
MSAFP MoM: 0.91
Maternal Age At EDD: 41.6 years
Osb Risk: 10000
Results:: NEGATIVE
Weight: 169 [lb_av]

## 2013-07-10 ENCOUNTER — Telehealth: Payer: Self-pay

## 2013-07-10 NOTE — Telephone Encounter (Signed)
RECEIVED 07/06/13 [facility]. SCANNED IN.  GIVEN TO KATHIE.

## 2013-07-27 ENCOUNTER — Encounter: Payer: Self-pay | Admitting: Specialist

## 2013-07-27 ENCOUNTER — Other Ambulatory Visit: Payer: Self-pay | Admitting: Specialist

## 2013-07-27 ENCOUNTER — Ambulatory Visit
Admission: RE | Admit: 2013-07-27 | Discharge: 2013-07-27 | Disposition: A | Discharge status: Home / Self Care | Payer: BLUE CROSS/BLUE SHIELD | Source: Ambulatory Visit | Attending: Specialist | Admitting: Specialist

## 2013-07-27 ENCOUNTER — Other Ambulatory Visit: Payer: Self-pay | Admitting: Obstetrics & Gynecology

## 2013-07-27 ENCOUNTER — Ambulatory Visit: Payer: BLUE CROSS/BLUE SHIELD | Admitting: Specialist

## 2013-07-27 VITALS — BP 132/79 | Wt 170.1 lb

## 2013-07-27 DIAGNOSIS — F53 Postpartum depression: Secondary | ICD-10-CM

## 2013-07-27 DIAGNOSIS — K219 Gastro-esophageal reflux disease without esophagitis: Secondary | ICD-10-CM | POA: Insufficient documentation

## 2013-07-27 DIAGNOSIS — O09529 Supervision of elderly multigravida, unspecified trimester: Secondary | ICD-10-CM | POA: Insufficient documentation

## 2013-07-27 DIAGNOSIS — O10019 Pre-existing essential hypertension complicating pregnancy, unspecified trimester: Secondary | ICD-10-CM | POA: Insufficient documentation

## 2013-07-27 DIAGNOSIS — Z3689 Encounter for other specified antenatal screening: Secondary | ICD-10-CM | POA: Insufficient documentation

## 2013-07-27 HISTORY — DX: Postpartum depression: F53.0

## 2013-07-27 LAB — POCT URINALYSIS DIPSTIX (10)(MULTI-TEST)
Bilirubin, UA POCT: NEGATIVE
Blood, UA POCT: NEGATIVE
Glucose, UA POCT: NEGATIVE mg/dL
Ketones, UA POCT: NEGATIVE mg/dL
Nitrite, UA POCT: NEGATIVE
POCT Spec Gravity, UA: 1.02 (ref 1.001–1.035)
POCT pH, UA: 6 (ref 5–8)
Protein, UA POCT: NEGATIVE mg/dL
Urobilinogen, UA: 0.2 mg/dL

## 2013-07-27 MED ORDER — BUDESONIDE 32 MCG/ACT NA SUSP
1.00 | Freq: Every day | NASAL | Status: AC
Start: 2013-07-27 — End: ?

## 2013-07-27 NOTE — Progress Notes (Signed)
Subjective:   Ms. Vicens is being seen today for her first obstetrical visit.  This is a planned pregnancy. She is G4P0101 at [redacted]w[redacted]d gestation. Her obstetrical history is significant for 42 and hypertension post partum PIH with the last pregnancy, PPROM at 36.5 weeks, severe post partum depression.. Relationship with FOB: spouse, living together. Patient does intend to breast feed. Pregnancy history fully reviewed.    Estimated Date of Delivery: 12/11/13    Menstrual History:  OB History     Grav Para Term Preterm Abortions TAB SAB Ect Mult Living    4 1  1    0   1         Menarche age: 42  Patient's last menstrual period was 03/07/2013.       The following portions of the patient's history were reviewed and updated as appropriate: allergies, current medications, past family history, past medical history, past social history, past surgical history and problem list.    Review of Systems  A comprehensive review of systems was negative.     Objective:   BP 132/79  Wt 170 lb 1 oz  LMP 03/07/2013  General physical exam done.  Findings were normal.  Pelvic exam done.  Findings were normal.    Ultrasound:  Level II normal Fetus at the 34th centile.  Assessment:   Ms. Obando is a 42 y.o. G4P0101 at [redacted]w[redacted]d gestation.     1. Benign essential hypertension antepartum, unspecified trimester    2. Pregnancy with history of infertility, unspecified trimester    3. 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    4. Hemorrhage after delivery of fetus, became very anemic.    5. History of pre-eclampsia in prior pregnancy, post partum. currently pregnant in third trimester    6. AMA (42) multigravida 35+, unspecified trimester    7. Esophageal reflux disease        Patient Active Problem List   Diagnosis   . 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   . Pregnancy with history of infertility, spontanous pregnancy.   . 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   .  Hemorrhage after delivery of fetus, became very anemic.   Marland Kitchen History of pre-eclampsia in prior pregnancy, post partum. currently pregnant in third trimester   . AMA (42) multigravida age 42   . Esophageal reflux disease       Plan:   Initial labsreviewed and entered in the results console  Prenatal vitamins. ASA 81 mg , Protonix, Rhinocort.  Problem list reviewed and updated.  Nuchal screen: results reviewed only scan done No PAPP-A   Panorama Normal  AFP discussed: results reviewed Normal  Role of ultrasound in pregnancy discussed; fetal survey: results reviewed normal  Increased risk of preterm delivery discussed Had PPROM .Will not use progesterone.  History of PIH Increased risk of recurrence. Started on ASA. Will do a 24 hour collection and will monitor PIH at home.  Increased risk of Post partum depression. Will start Lexapro if needed.  Discussed medications including prenatal vitamins, iron and stool softeners.  Discussed the use of pain medications and antibiotics allowed in pregnancy.  Follow up in 4 weeks.  85 min visit spent on counseling and coordination of care.

## 2013-08-02 ENCOUNTER — Other Ambulatory Visit: Payer: Self-pay | Admitting: Specialist

## 2013-08-02 ENCOUNTER — Ambulatory Visit: Payer: BLUE CROSS/BLUE SHIELD | Admitting: Specialist

## 2013-08-02 ENCOUNTER — Inpatient Hospital Stay (HOSPITAL_BASED_OUTPATIENT_CLINIC_OR_DEPARTMENT_OTHER)
Admission: RE | Admit: 2013-08-02 | Discharge: 2013-08-02 | Disposition: A | Discharge status: Home / Self Care | Payer: BLUE CROSS/BLUE SHIELD | Source: Ambulatory Visit | Attending: Specialist | Admitting: Specialist

## 2013-08-02 ENCOUNTER — Inpatient Hospital Stay
Admission: RE | Admit: 2013-08-02 | Discharge: 2013-08-02 | Disposition: A | Discharge status: Home / Self Care | Payer: BLUE CROSS/BLUE SHIELD | Source: Ambulatory Visit | Attending: Specialist | Admitting: Specialist

## 2013-08-02 ENCOUNTER — Inpatient Hospital Stay (HOSPITAL_BASED_OUTPATIENT_CLINIC_OR_DEPARTMENT_OTHER)
Admission: RE | Admit: 2013-08-02 | Discharge: 2013-08-02 | Disposition: A | Discharge status: Home / Self Care | Payer: BLUE CROSS/BLUE SHIELD | Source: Ambulatory Visit

## 2013-08-02 VITALS — BP 121/65 | Wt 169.3 lb

## 2013-08-02 DIAGNOSIS — O09899 Supervision of other high risk pregnancies, unspecified trimester: Secondary | ICD-10-CM | POA: Insufficient documentation

## 2013-08-02 DIAGNOSIS — Z36 Encounter for antenatal screening of mother: Secondary | ICD-10-CM | POA: Insufficient documentation

## 2013-08-02 LAB — POCT URINALYSIS DIPSTICK (5)
Blood, UA POCT: NEGATIVE
Glucose, UA POCT: NEGATIVE mg/dL
Ketones, UA POCT: NEGATIVE mg/dL
POCT pH, UA: 6 (ref 5–8)
Protein, UA POCT: NEGATIVE mg/dL

## 2013-08-02 NOTE — Progress Notes (Signed)
Subjective:   Ms. Pizana is a 42 y.o. female being seen today for her obstetrical visit. She is [id number] at [redacted]w[redacted]d gestation. Patient reports lower back pain and vaginal cramping which started yesterday at noon.  Having it a little bit now.  Cervix looks good on U/S.Marland Kitchen Fetal movement: normal.    Estimated Date of Delivery: 12/11/13     The following portions of the patient's history were reviewed and updated as appropriate: allergies, current medications, past family history, past medical history, past social history, past surgical history and problem list.  Objective:   LMP 03/07/2013  FHT: 145 BPM   Uterine Size: size equals dates   Pelvic Exam:   Not done   Presentation: vertex       U/S: 1) Single intrauterine pregnancy in vertex presentation.  2) Amniotic fluid index is 14.1 cm, within normal limits.  3) Targeted anatomy survey was not performed.      Cervix viewed transvaginally measures 3.4-3.6 cm long and appears closed.      Assessment:   Ms. Shutes is a 42 y.o. [id number] at [redacted]w[redacted]d gestation.    Patient Active Problem List   Diagnosis   . 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   . Pregnancy with history of infertility, spontanous pregnancy.   . 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   . Hemorrhage after delivery of fetus, became very anemic.   Marland Kitchen History of pre-eclampsia in prior pregnancy, post partum. currently pregnant in third trimester   . AMA (advanced maternal age) multigravida age 47   . Esophageal reflux disease      Plan:   Targeted (level II) ultrasound performed and is normal.  Discussed gestational diabetes testing and ordered 24-28 week labs: to be ordered at next visit.Marland Kitchen  Discussed S/SX of preterm labor and premature rupture of membranes.  Discussed monitoring of blood pressure, weight and urine protein at home.  Discussed medications including prenatal vitamins, iron and stool softeners.  Discussed the use of pain medications and antibiotics allowed in  pregnancy.  Patient advised to take Ibuprofen 600 mg po q 6 h for 24 hours only.  Follow up in 3 weeks at [facility] for visit and EFW/AFI.    Madaline Savage. Beryle Beams, M.D.

## 2013-08-09 ENCOUNTER — Encounter: Payer: Self-pay | Admitting: Specialist

## 2013-08-09 ENCOUNTER — Other Ambulatory Visit: Payer: Self-pay | Admitting: Specialist

## 2013-08-10 ENCOUNTER — Telehealth: Payer: Self-pay

## 2013-08-10 LAB — CREATININE CLEARANCE
Creatinine 24HR, UR: 1032 mg/24 hr (ref 800.0–1800.0)
Creatinine Clearance: 149 mL/min — ABNORMAL HIGH (ref 88–128)
Creatinine, UR: 68.8 mg/dL (ref 15.0–278.0)
Creatinine: 0.48 mg/dL — ABNORMAL LOW (ref 0.57–1.00)
EGFR: 122 mL/min/{1.73_m2} (ref >59)
EGFR: 141 mL/min/{1.73_m2} (ref >59)

## 2013-08-10 LAB — PROTEIN, URINE, 24 HOUR
Protein 24HR, UR: 108 mg/24 hr (ref 30.0–150.0)
Protein, UR: 7.2 mg/dL (ref 0.0–15.0)

## 2013-08-10 NOTE — Telephone Encounter (Signed)
RECEIVED 08/09/2013 CREATININE CLEARANCE AND 24 HR URINE.  SCANNED IN.  GIVEN TO KATHIE.

## 2013-08-12 LAB — SPECIMEN STATUS REPORT

## 2013-08-24 ENCOUNTER — Ambulatory Visit
Admission: RE | Admit: 2013-08-24 | Discharge: 2013-08-24 | Disposition: A | Discharge status: Home / Self Care | Payer: BLUE CROSS/BLUE SHIELD | Source: Ambulatory Visit | Attending: Specialist | Admitting: Specialist

## 2013-08-24 ENCOUNTER — Ambulatory Visit: Payer: BLUE CROSS/BLUE SHIELD | Admitting: Specialist

## 2013-08-24 ENCOUNTER — Other Ambulatory Visit: Payer: Self-pay | Admitting: Specialist

## 2013-08-24 VITALS — BP 133/75 | Wt 169.3 lb

## 2013-08-24 DIAGNOSIS — Z363 Encounter for antenatal screening for malformations: Secondary | ICD-10-CM | POA: Insufficient documentation

## 2013-08-24 LAB — POCT URINALYSIS DIPSTIX (10)(MULTI-TEST)
Bilirubin, UA POCT: NEGATIVE
Blood, UA POCT: NEGATIVE
Glucose, UA POCT: NEGATIVE mg/dL
Ketones, UA POCT: NEGATIVE mg/dL
Nitrite, UA POCT: NEGATIVE
POCT Leukocytes, UA: NEGATIVE
POCT Spec Gravity, UA: 1.005 (ref 1.001–1.035)
POCT pH, UA: 6.5 (ref 5–8)
Protein, UA POCT: NEGATIVE mg/dL
Urobilinogen, UA: 0.2 mg/dL

## 2013-08-24 NOTE — Progress Notes (Unsigned)
Subjective:   Makayla Sullivan is a 42 y.o. female being seen today for her obstetrical visit. She is [id number] at [redacted]w[redacted]d gestation. Patient reports no complaints. Fetal movement: normal.    Estimated Date of Delivery: 12/11/13     The following portions of the patient's history were reviewed and updated as appropriate: allergies, current medications, past family history, past medical history, past social history, past surgical history and problem list.  Objective:   LMP 03/07/2013  FHT: 134 BPM   Uterine Size: size equals dates   Pelvic Exam:   Not done   Presentation: vertex   1) Single intrauterine pregnancy in vertex presentation.  2) Estimated fetal weight is 703 grams, at the 51st percentile, within normal limits.  3) Amniotic fluid index is 13.9 cm, within normal limits.  4) Targeted anatomy survey was not performed.    Assessment:   Makayla Sullivan is a 42 y.o. [id number] at [redacted]w[redacted]d gestation.    Patient Active Problem List   Diagnosis   . 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   . Pregnancy with history of infertility, spontanous pregnancy.   . 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   . Hemorrhage after delivery of fetus, became very anemic.   Marland Kitchen History of pre-eclampsia in prior pregnancy, post partum. currently pregnant in third trimester   . AMA (advanced maternal age) multigravida age 16   . Esophageal reflux disease      Plan:   Discussed gestational diabetes testing and ordered 24-28 week labs: given order sheet.  24 hour urine done and is normal. 08/09/13  Discussed S/SX of preterm labor and premature rupture of membranes.  Discussed monitoring of blood pressure, weight and urine protein at home.  Discussed bedrest.  Asked patient to arrange for a hospital tour.  Discussed classes.  Discussed medications including prenatal vitamins, iron and stool softeners.  Discussed the use of pain medications and antibiotics allowed in pregnancy.  Follow up in 3 weeks.

## 2013-09-11 ENCOUNTER — Encounter: Payer: Self-pay | Admitting: Specialist

## 2013-09-12 ENCOUNTER — Telehealth: Payer: Self-pay

## 2013-09-12 LAB — CBC AND DIFFERENTIAL
Baso(Absolute): 0 10*3/uL (ref 0.0–0.2)
Basos: 0 %
Eos: 1 %
Eosinophils Absolute: 0.1 10*3/uL (ref 0.0–0.4)
Hematocrit: 34.1 % (ref 34.0–46.6)
Hemoglobin: 11.4 g/dL (ref 11.1–15.9)
Immature Granulocytes Absolute: 0 10*3/uL (ref 0.0–0.1)
Immature Granulocytes: 0 %
Lymphocytes Absolute: 1.5 10*3/uL (ref 0.7–3.1)
Lymphocytes: 17 %
MCH: 28.6 pg (ref 26.6–33.0)
MCHC: 33.4 g/dL (ref 31.5–35.7)
MCV: 86 fL (ref 79–97)
Monocytes Absolute: 0.4 10*3/uL (ref 0.1–0.9)
Monocytes: 5 %
Neutrophils Absolute: 6.6 10*3/uL (ref 1.4–7.0)
Neutrophils: 77 %
Platelets: 221 10*3/uL (ref 150–379)
RBC: 3.99 x10E6/uL (ref 3.77–5.28)
RDW: 14.3 % (ref 12.3–15.4)
WBC: 8.5 10*3/uL (ref 3.4–10.8)

## 2013-09-12 LAB — RPR: RPR: NONREACTIVE

## 2013-09-12 LAB — GESTATIONAL DIABETES 1-HOUR SCREEN, SERUM: Gestational Diabetes Screen: 156 mg/dL (ref 65–139)

## 2013-09-12 LAB — ANTIBODY SCREEN: AB Screen Gel: NEGATIVE

## 2013-09-12 NOTE — Telephone Encounter (Signed)
RECEIVED 09/11/2013 24-28 WK LABS (CBC, 1 HR GLT, RPR, AB SCREEN).  SCANNED IN.  GIVEN TO KATHIE.

## 2013-09-14 ENCOUNTER — Other Ambulatory Visit: Payer: Self-pay | Admitting: Specialist

## 2013-09-14 ENCOUNTER — Ambulatory Visit
Admission: RE | Admit: 2013-09-14 | Discharge: 2013-09-14 | Disposition: A | Discharge status: Home / Self Care | Payer: BLUE CROSS/BLUE SHIELD | Source: Ambulatory Visit | Attending: Specialist | Admitting: Specialist

## 2013-09-14 ENCOUNTER — Ambulatory Visit: Payer: BLUE CROSS/BLUE SHIELD | Admitting: Specialist

## 2013-09-14 VITALS — BP 134/70 | Wt 169.6 lb

## 2013-09-14 DIAGNOSIS — O09899 Supervision of other high risk pregnancies, unspecified trimester: Secondary | ICD-10-CM | POA: Insufficient documentation

## 2013-09-14 LAB — POCT URINALYSIS DIPSTIX (10)(MULTI-TEST)
Bilirubin, UA POCT: NEGATIVE
Blood, UA POCT: NEGATIVE
Glucose, UA POCT: NEGATIVE mg/dL
Ketones, UA POCT: NEGATIVE mg/dL
Nitrite, UA POCT: NEGATIVE
POCT Leukocytes, UA: NEGATIVE
POCT Spec Gravity, UA: 1.01 (ref 1.001–1.035)
POCT pH, UA: 6.5 (ref 5–8)
Protein, UA POCT: NEGATIVE mg/dL
Urobilinogen, UA: 0.2 mg/dL

## 2013-09-14 NOTE — Progress Notes (Signed)
Subjective:   [name] is a 42 y.o. female being seen today for her obstetrical visit. She is at [redacted]w[redacted]d gestation. Patient reports no complaints. Fetal movement: normal. Fetal growth starting to Lag    Estimated Date of Delivery: 12/11/13     The following portions of the patient's history were reviewed and updated as appropriate: allergies, current medications, past family history, past medical history, past social history, past surgical history and problem list.  Objective:   LMP 03/07/2013  FHT: 136 BPM  Uterine Size:   size equals dates  Presentation:  vertex    Pelvic Exam:    Not done      1) Single intrauterine pregnancy in vertex presentation.  2) Estimated fetal weight is 942 grams, at the 25th percentile; AC AT 8%.  3) Amniotic fluid index is 16.1 cm, within normal limits.  4) Umbilical doppler systolic/diastolic ratio is 2.8;  within normal limits.  5) Targeted anatomy survey was not performed.  Assessment:   [name] is a 42 y.o. [id number] at [redacted]w[redacted]d gestation.      Patient Active Problem List   Diagnosis   . 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   . Pregnancy with history of infertility, spontanous pregnancy.   . 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   . Hemorrhage after delivery of fetus, became very anemic.   Marland Kitchen History of pre-eclampsia in prior pregnancy, post partum. currently pregnant in third trimester   . AMA (advanced maternal age) multigravida age 37   . Esophageal reflux disease      Plan:   28-week labs reviewed, including GLT abnormal 156, CBC and STS normal. Ordered 3Hour GTT  Vigilant home monitoring of PIH  Increase weight gain  Cord blood collection discussed.  Patient aware of donation, private collection or medical waste.  Discussed S/SX of preterm labor and premature rupture of membranes.  Asked patient to arrange for a hospital tour.  Gave patient information about classes for prenatal and breast feeding classes.  Discussed mode and timing of delivery.  Plans for delivery: Vaginal anticipated.  Consents given to patient.  Follow up in 12/22

## 2013-09-27 ENCOUNTER — Telehealth: Payer: Self-pay

## 2013-09-27 LAB — GESTATIONAL GLUCOSE TOLERANCE, SERUM
Glucose, GTT - 1 Hour: 178 mg/dL (ref 65–179)
Glucose, GTT - 2 Hour: 150 mg/dL (ref 65–154)
Glucose, GTT - 3 Hour: 68 mg/dL (ref 65–139)
Glucose, GTT - Fasting: 91 mg/dL (ref 65–94)

## 2013-09-27 NOTE — Telephone Encounter (Signed)
RECEIVED 09/26/2013 3 HR GLT.  SCANNED IN.  GIVEN TO KATHIE.

## 2013-10-02 ENCOUNTER — Ambulatory Visit: Payer: BLUE CROSS/BLUE SHIELD | Admitting: Specialist

## 2013-10-02 ENCOUNTER — Other Ambulatory Visit: Payer: Self-pay | Admitting: Specialist

## 2013-10-02 ENCOUNTER — Ambulatory Visit
Admission: RE | Admit: 2013-10-02 | Discharge: 2013-10-02 | Disposition: A | Discharge status: Home / Self Care | Payer: BLUE CROSS/BLUE SHIELD | Source: Ambulatory Visit | Attending: Specialist | Admitting: Specialist

## 2013-10-02 VITALS — BP 137/82 | Wt 172.0 lb

## 2013-10-02 DIAGNOSIS — O0990 Supervision of high risk pregnancy, unspecified, unspecified trimester: Secondary | ICD-10-CM | POA: Insufficient documentation

## 2013-10-02 LAB — POCT URINALYSIS DIPSTIX (10)(MULTI-TEST)
Bilirubin, UA POCT: NEGATIVE
Blood, UA POCT: NEGATIVE
Glucose, UA POCT: NEGATIVE mg/dL
Ketones, UA POCT: NEGATIVE mg/dL
Nitrite, UA POCT: NEGATIVE
POCT Leukocytes, UA: NEGATIVE
POCT Spec Gravity, UA: 1.01 (ref 1.001–1.035)
POCT pH, UA: 6 (ref 5–8)
Protein, UA POCT: NEGATIVE mg/dL
Urobilinogen, UA: 0.2 mg/dL

## 2013-10-02 NOTE — Progress Notes (Signed)
Subjective:   Makayla Sullivan is a 42 y.o. female being seen today for her obstetrical visit. She is at [redacted]w[redacted]d gestation. Patient reports no complaints. Fetal movement: normal.    Estimated Date of Delivery: 12/11/13     The following portions of the patient's history were reviewed and updated as appropriate: allergies, current medications, past family history, past medical history, past social history, past surgical history and problem list.  Objective:   BP 137/82  LMP 03/07/2013  FHT: 143 BPM  Uterine Size:   size equals dates  Presentation:  breech    Pelvic Exam:    Not done      Antenatal Testing:  Not done.    U/S:  1) Single intrauterine pregnancy in frank breech presentation.  2) Estimated fetal weight is 1352 grams, at the 28th percentile, within normal limits.   3) Amniotic fluid index is 13.4 cm, within normal limits.  4) Targeted anatomy survey was not performed.    Assessment:   Makayla Sullivan is a 42 y.o. [id number] at [redacted]w[redacted]d gestation.      Patient Active Problem List   Diagnosis   . 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   . Pregnancy with history of infertility, spontanous pregnancy.   . 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   . Hemorrhage after delivery of fetus, became very anemic.   Marland Kitchen History of pre-eclampsia in prior pregnancy, post partum. currently pregnant in third trimester   . AMA (advanced maternal age) multigravida age 50   . Esophageal reflux disease      Plan:   28-week labs reviewed, including GLT, CBC and STS abnormal.  Elevated 1 hour GLT. Normal 3 hour GTT.  Cord blood collection discussed.  Patient aware of donation, private collection or medical waste.  Beta strep culture: discussed. At 35 weeks.  Patient given fetal motion chart/chart reviewed..  Fetal testing: Weekly NST and AFI at 36 weeks..  Discussed S/SX of preterm labor and premature rupture of membranes.  Asked patient to arrange for a hospital tour.  Gave patient information about classes for prenatal  and breast feeding classes.  Discussed mode and timing of delivery. Plans for delivery: pending fetal lie..  Follow up in 2 Weeks with visit.    Makayla Sullivan, M.D.

## 2013-10-16 ENCOUNTER — Ambulatory Visit: Payer: BLUE CROSS/BLUE SHIELD | Admitting: Specialist

## 2013-10-16 ENCOUNTER — Ambulatory Visit
Admission: RE | Admit: 2013-10-16 | Discharge: 2013-10-16 | Disposition: A | Discharge status: Home / Self Care | Payer: BLUE CROSS/BLUE SHIELD | Source: Ambulatory Visit | Attending: Specialist | Admitting: Specialist

## 2013-10-16 ENCOUNTER — Other Ambulatory Visit: Payer: Self-pay | Admitting: Specialist

## 2013-10-16 VITALS — BP 138/83 | Wt 171.6 lb

## 2013-10-16 DIAGNOSIS — F3289 Other specified depressive episodes: Secondary | ICD-10-CM

## 2013-10-16 DIAGNOSIS — F53 Postpartum depression: Secondary | ICD-10-CM

## 2013-10-16 DIAGNOSIS — O09293 Supervision of pregnancy with other poor reproductive or obstetric history, third trimester: Secondary | ICD-10-CM

## 2013-10-16 DIAGNOSIS — O0990 Supervision of high risk pregnancy, unspecified, unspecified trimester: Secondary | ICD-10-CM | POA: Insufficient documentation

## 2013-10-16 DIAGNOSIS — O09529 Supervision of elderly multigravida, unspecified trimester: Secondary | ICD-10-CM

## 2013-10-16 DIAGNOSIS — O0900 Supervision of pregnancy with history of infertility, unspecified trimester: Secondary | ICD-10-CM

## 2013-10-16 DIAGNOSIS — O09299 Supervision of pregnancy with other poor reproductive or obstetric history, unspecified trimester: Secondary | ICD-10-CM

## 2013-10-16 DIAGNOSIS — O99345 Other mental disorders complicating the puerperium: Secondary | ICD-10-CM

## 2013-10-16 DIAGNOSIS — K219 Gastro-esophageal reflux disease without esophagitis: Secondary | ICD-10-CM

## 2013-10-16 DIAGNOSIS — O09899 Supervision of other high risk pregnancies, unspecified trimester: Secondary | ICD-10-CM

## 2013-10-16 DIAGNOSIS — O10019 Pre-existing essential hypertension complicating pregnancy, unspecified trimester: Secondary | ICD-10-CM

## 2013-10-16 LAB — POCT URINALYSIS DIPSTIX (10)(MULTI-TEST)
Bilirubin, UA POCT: NEGATIVE
Blood, UA POCT: NEGATIVE
Glucose, UA POCT: NEGATIVE mg/dL
Ketones, UA POCT: NEGATIVE mg/dL
Nitrite, UA POCT: NEGATIVE
POCT Leukocytes, UA: NEGATIVE
POCT pH, UA: 7 (ref 5–8)
Protein, UA POCT: NEGATIVE mg/dL
Urobilinogen, UA: 0.2 mg/dL

## 2013-10-16 NOTE — Progress Notes (Signed)
Subjective:   Makayla Sullivan is a 43 y.o. female being seen today for her obstetrical visit. She is at [redacted]w[redacted]d gestation. Patient reports no complaints. Fetal movement: has noticed less fetal movement over the past 2 days..    Estimated Date of Delivery: 12/11/13     The following portions of the patient's history were reviewed and updated as appropriate: allergies, current medications, past family history, past medical history, past social history, past surgical history and problem list.  Objective:   LMP 03/07/2013  FHT: 136 BPM  Uterine Size:   size equals dates  Presentation:  vertex    Pelvic Exam:    Not done      Antenatal Testing:  NST:  reactive  Assessment:   Makayla Sullivan is a 43 y.o. [id number] at [redacted]w[redacted]d gestation.      Patient Active Problem List   Diagnosis   . 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   . Pregnancy with history of infertility, spontanous pregnancy.   . 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   . Hemorrhage after delivery of fetus, became very anemic.   Marland Kitchen History of pre-eclampsia in prior pregnancy, post partum. currently pregnant in third trimester   . AMA (advanced maternal age) multigravida age 43   . Esophageal reflux disease      Plan:   28-week labs reviewed, including GLT, CBC and STS abnormal 1hr GLT with normal 3hr GTT.Marland Kitchen  Cord blood collection discussed.  Patient aware of donation, private collection or medical waste.  Beta strep culture: discussed.  Patient given fetal motion chart/chart reviewed.  Fetal testing: NST today..  Discussed S/SX of preterm labor and premature rupture of membranes.  Asked patient to arrange for a hospital tour.  Gave patient information about classes for prenatal and breast feeding classes.  Discussed mode and timing of delivery. Plans for delivery: Vaginal anticipated.  Consents given to patient.  Follow up in 2 Weeks with visit, interval growth, AFI and NST.    Madaline Savage. Beryle Beams, M.D.

## 2013-11-02 ENCOUNTER — Ambulatory Visit
Admission: RE | Admit: 2013-11-02 | Discharge: 2013-11-02 | Disposition: A | Discharge status: Home / Self Care | Payer: BLUE CROSS/BLUE SHIELD | Source: Ambulatory Visit | Attending: Specialist | Admitting: Specialist

## 2013-11-02 ENCOUNTER — Ambulatory Visit: Payer: BLUE CROSS/BLUE SHIELD | Admitting: Specialist

## 2013-11-02 ENCOUNTER — Other Ambulatory Visit: Payer: Self-pay | Admitting: Specialist

## 2013-11-02 VITALS — BP 133/84 | Wt 172.4 lb

## 2013-11-02 DIAGNOSIS — K219 Gastro-esophageal reflux disease without esophagitis: Secondary | ICD-10-CM

## 2013-11-02 DIAGNOSIS — O09899 Supervision of other high risk pregnancies, unspecified trimester: Secondary | ICD-10-CM

## 2013-11-02 DIAGNOSIS — O10013 Pre-existing essential hypertension complicating pregnancy, third trimester: Secondary | ICD-10-CM

## 2013-11-02 DIAGNOSIS — O0900 Supervision of pregnancy with history of infertility, unspecified trimester: Secondary | ICD-10-CM

## 2013-11-02 DIAGNOSIS — O09523 Supervision of elderly multigravida, third trimester: Secondary | ICD-10-CM

## 2013-11-02 DIAGNOSIS — F53 Postpartum depression: Secondary | ICD-10-CM

## 2013-11-02 DIAGNOSIS — O09299 Supervision of pregnancy with other poor reproductive or obstetric history, unspecified trimester: Secondary | ICD-10-CM

## 2013-11-02 DIAGNOSIS — O09529 Supervision of elderly multigravida, unspecified trimester: Secondary | ICD-10-CM

## 2013-11-02 DIAGNOSIS — O99345 Other mental disorders complicating the puerperium: Secondary | ICD-10-CM

## 2013-11-02 DIAGNOSIS — O0903 Supervision of pregnancy with history of infertility, third trimester: Secondary | ICD-10-CM

## 2013-11-02 DIAGNOSIS — O09293 Supervision of pregnancy with other poor reproductive or obstetric history, third trimester: Secondary | ICD-10-CM

## 2013-11-02 DIAGNOSIS — F3289 Other specified depressive episodes: Secondary | ICD-10-CM

## 2013-11-02 DIAGNOSIS — O10019 Pre-existing essential hypertension complicating pregnancy, unspecified trimester: Secondary | ICD-10-CM

## 2013-11-02 DIAGNOSIS — O365990 Maternal care for other known or suspected poor fetal growth, unspecified trimester, not applicable or unspecified: Secondary | ICD-10-CM

## 2013-11-02 LAB — POCT URINALYSIS DIPSTIX (10)(MULTI-TEST)
Bilirubin, UA POCT: NEGATIVE
Blood, UA POCT: NEGATIVE
Glucose, UA POCT: NEGATIVE mg/dL
Ketones, UA POCT: NEGATIVE mg/dL
Nitrite, UA POCT: NEGATIVE
POCT Leukocytes, UA: NEGATIVE
POCT Spec Gravity, UA: 1.01 (ref 1.001–1.035)
POCT pH, UA: 6 (ref 5–8)
Protein, UA POCT: NEGATIVE mg/dL
Urobilinogen, UA: 0.2 mg/dL

## 2013-11-02 NOTE — Progress Notes (Signed)
Subjective:   Ms. Bohan is a 43 y.o. female being seen today for her obstetrical visit. She is at [redacted]w[redacted]d gestation. Patient reports no complaints. Fetal movement: normal.    Estimated Date of Delivery: 12/11/13     The following portions of the patient's history were reviewed and updated as appropriate: allergies, current medications, past family history, past medical history, past social history, past surgical history and problem list.  Objective:   LMP 03/07/2013  FHT: 134 BPM  Uterine Size:   size equals dates  Presentation:  vertex    Pelvic Exam:    Not done      1) Single intrauterine pregnancy in vertex presentation.  2) Estimated fetal weight is  2274 grams, at the 37th percentile.  3) Amniotic fluid index is 12.8 cm, within normal limits.  4) Targeted anatomy survey was not performed.  Assessment:   Ms. Renzulli is a 43 y.o. [id number] at [redacted]w[redacted]d gestation.      Patient Active Problem List   Diagnosis   . 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   . Pregnancy with history of infertility, spontanous pregnancy.   . 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   . Hemorrhage after delivery of fetus, became very anemic.   Marland Kitchen History of pre-eclampsia in prior pregnancy, post partum. currently pregnant in third trimester   . AMA (advanced maternal age) multigravida age 44   . Esophageal reflux disease      Plan:   Discussed S/SX of preterm labor and premature rupture of membranes.  Asked patient to arrange for a hospital tour.  Gave patient information about classes for prenatal and breast feeding classes.  Discussed mode and timing of delivery. Plans for delivery: Vaginal anticipated.at 39 week  Consents given to patient.  Follow up in 1 Week

## 2013-11-09 ENCOUNTER — Ambulatory Visit
Admission: RE | Admit: 2013-11-09 | Discharge: 2013-11-09 | Disposition: A | Discharge status: Home / Self Care | Payer: BLUE CROSS/BLUE SHIELD | Source: Ambulatory Visit | Attending: Specialist | Admitting: Specialist

## 2013-11-09 ENCOUNTER — Ambulatory Visit: Payer: BLUE CROSS/BLUE SHIELD | Admitting: Specialist

## 2013-11-09 VITALS — BP 131/81 | Wt 173.0 lb

## 2013-11-09 DIAGNOSIS — O10013 Pre-existing essential hypertension complicating pregnancy, third trimester: Secondary | ICD-10-CM

## 2013-11-09 DIAGNOSIS — O09529 Supervision of elderly multigravida, unspecified trimester: Secondary | ICD-10-CM

## 2013-11-09 DIAGNOSIS — O09293 Supervision of pregnancy with other poor reproductive or obstetric history, third trimester: Secondary | ICD-10-CM

## 2013-11-09 DIAGNOSIS — O10019 Pre-existing essential hypertension complicating pregnancy, unspecified trimester: Secondary | ICD-10-CM

## 2013-11-09 DIAGNOSIS — O0903 Supervision of pregnancy with history of infertility, third trimester: Secondary | ICD-10-CM

## 2013-11-09 DIAGNOSIS — K219 Gastro-esophageal reflux disease without esophagitis: Secondary | ICD-10-CM

## 2013-11-09 DIAGNOSIS — O09899 Supervision of other high risk pregnancies, unspecified trimester: Secondary | ICD-10-CM | POA: Insufficient documentation

## 2013-11-09 DIAGNOSIS — O9989 Other specified diseases and conditions complicating pregnancy, childbirth and the puerperium: Secondary | ICD-10-CM

## 2013-11-09 DIAGNOSIS — O99345 Other mental disorders complicating the puerperium: Secondary | ICD-10-CM

## 2013-11-09 DIAGNOSIS — F3289 Other specified depressive episodes: Secondary | ICD-10-CM

## 2013-11-09 DIAGNOSIS — O0900 Supervision of pregnancy with history of infertility, unspecified trimester: Secondary | ICD-10-CM

## 2013-11-09 DIAGNOSIS — F53 Postpartum depression: Secondary | ICD-10-CM

## 2013-11-09 DIAGNOSIS — O09299 Supervision of pregnancy with other poor reproductive or obstetric history, unspecified trimester: Secondary | ICD-10-CM

## 2013-11-09 DIAGNOSIS — O09523 Supervision of elderly multigravida, third trimester: Secondary | ICD-10-CM

## 2013-11-09 LAB — POCT URINALYSIS DIPSTIX (10)(MULTI-TEST)
Bilirubin, UA POCT: NEGATIVE
Blood, UA POCT: NEGATIVE
Glucose, UA POCT: NEGATIVE mg/dL
Ketones, UA POCT: NEGATIVE mg/dL
Nitrite, UA POCT: NEGATIVE
POCT Leukocytes, UA: NEGATIVE
POCT Spec Gravity, UA: 1.025 (ref 1.001–1.035)
POCT pH, UA: 0.5 — AB (ref 5–8)
Urobilinogen, UA: 0.2 mg/dL

## 2013-11-09 LAB — GROUP B STREP TRANSCRIBED: GBS Transcribed: NEGATIVE

## 2013-11-09 NOTE — Progress Notes (Signed)
Subjective:   Makayla Sullivan is a 43 y.o. female being seen today for her obstetrical visit. She is at [redacted]w[redacted]d gestation. Patient reports no complaints. Fetal movement: normal.    Estimated Date of Delivery: 12/11/13     The following portions of the patient's history were reviewed and updated as appropriate: allergies, current medications, past family history, past medical history, past social history, past surgical history and problem list.  Objective:   LMP 03/07/2013  FHT: 143 BPM  Uterine Size:   size equals dates  Presentation:  vertex    Pelvic Exam:    Dilation:  FT  Consistency:  soft      Antenatal Testing:  NST:  reactive  Assessment:   Makayla Sullivan is a 43 y.o. [id number] at [redacted]w[redacted]d gestation.      Patient Active Problem List   Diagnosis   . 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   . Pregnancy with history of infertility, spontanous pregnancy.   . 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   . Hemorrhage after delivery of fetus, became very anemic.   Marland Kitchen History of pre-eclampsia in prior pregnancy, post partum. currently pregnant in third trimester   . AMA (advanced maternal age) multigravida age 43   . Esophageal reflux disease      Plan:   28-week labs reviewed, including GLT, CBC and STS, abnormal 1 hr GLT with normal 3 hr GTT.  Cord blood collection discussed.  Patient aware of donation, private collection or medical waste.  Beta strep culture: done today.  Fetal testing: Weekly NST and AFI.Marland Kitchen  Discussed S/SX of preterm labor and premature rupture of membranes.  Asked patient to arrange for a hospital tour.  Gave patient information about classes for prenatal and breast feeding classes.  Discussed mode and timing of delivery. Plans for delivery: Vaginal anticipated.  Patient will discuss scheduling of induction with Dr. Gracelyn Nurse at next visit.  Follow up in 1 Week for visit, NST and AFI.    Madaline Savage. Beryle Beams, M.D.

## 2013-11-16 ENCOUNTER — Encounter: Payer: Self-pay | Admitting: Specialist

## 2013-11-16 ENCOUNTER — Ambulatory Visit
Admission: RE | Admit: 2013-11-16 | Discharge: 2013-11-16 | Disposition: A | Discharge status: Home / Self Care | Payer: BLUE CROSS/BLUE SHIELD | Source: Ambulatory Visit | Attending: Specialist | Admitting: Specialist

## 2013-11-16 ENCOUNTER — Ambulatory Visit: Payer: BLUE CROSS/BLUE SHIELD | Admitting: Specialist

## 2013-11-16 ENCOUNTER — Other Ambulatory Visit: Payer: Self-pay | Admitting: Specialist

## 2013-11-16 VITALS — BP 142/82 | Wt 173.0 lb

## 2013-11-16 DIAGNOSIS — F53 Postpartum depression: Secondary | ICD-10-CM

## 2013-11-16 DIAGNOSIS — O9989 Other specified diseases and conditions complicating pregnancy, childbirth and the puerperium: Secondary | ICD-10-CM

## 2013-11-16 DIAGNOSIS — O0903 Supervision of pregnancy with history of infertility, third trimester: Secondary | ICD-10-CM

## 2013-11-16 DIAGNOSIS — O99345 Other mental disorders complicating the puerperium: Secondary | ICD-10-CM

## 2013-11-16 DIAGNOSIS — O09529 Supervision of elderly multigravida, unspecified trimester: Secondary | ICD-10-CM

## 2013-11-16 DIAGNOSIS — O0900 Supervision of pregnancy with history of infertility, unspecified trimester: Secondary | ICD-10-CM

## 2013-11-16 DIAGNOSIS — F3289 Other specified depressive episodes: Secondary | ICD-10-CM

## 2013-11-16 DIAGNOSIS — O09899 Supervision of other high risk pregnancies, unspecified trimester: Secondary | ICD-10-CM

## 2013-11-16 DIAGNOSIS — O10019 Pre-existing essential hypertension complicating pregnancy, unspecified trimester: Secondary | ICD-10-CM

## 2013-11-16 DIAGNOSIS — K219 Gastro-esophageal reflux disease without esophagitis: Secondary | ICD-10-CM

## 2013-11-16 DIAGNOSIS — O09293 Supervision of pregnancy with other poor reproductive or obstetric history, third trimester: Secondary | ICD-10-CM

## 2013-11-16 DIAGNOSIS — O09299 Supervision of pregnancy with other poor reproductive or obstetric history, unspecified trimester: Secondary | ICD-10-CM

## 2013-11-16 DIAGNOSIS — O10013 Pre-existing essential hypertension complicating pregnancy, third trimester: Secondary | ICD-10-CM

## 2013-11-16 DIAGNOSIS — O09523 Supervision of elderly multigravida, third trimester: Secondary | ICD-10-CM

## 2013-11-16 DIAGNOSIS — Z3480 Encounter for supervision of other normal pregnancy, unspecified trimester: Secondary | ICD-10-CM | POA: Insufficient documentation

## 2013-11-16 LAB — POCT URINALYSIS DIPSTIX (10)(MULTI-TEST)
Bilirubin, UA POCT: NEGATIVE
Blood, UA POCT: NEGATIVE
Glucose, UA POCT: NEGATIVE mg/dL
Ketones, UA POCT: NEGATIVE mg/dL
Nitrite, UA POCT: NEGATIVE
POCT Leukocytes, UA: NEGATIVE
POCT Spec Gravity, UA: 1.001 (ref 1.001–1.035)
Protein, UA POCT: NEGATIVE mg/dL
Urobilinogen, UA: 0.2 mg/dL

## 2013-11-16 NOTE — Progress Notes (Signed)
Subjective:   Makayla Sullivan is a 43 y.o. female being seen today for her obstetrical visit. She is [id number] at [redacted]w[redacted]d gestation. Patient reports no complaints. Fetal movement: normal.    Estimated Date of Delivery: 12/11/13    The following portions of the patient's history were reviewed and updated as appropriate: allergies, current medications, past family history, past medical history, past social history, past surgical history and problem list.    Review of Systems  A comprehensive review of systems was negative.  Objective:   LMP 03/07/2013  FHT:  160 BPM  Uterine Size:  size equals dates  Presentation:  vertex  Pelvic Exam:          Dilation: Closed   Effacement: 25%          Station:  -3            Consistency: firm        Position: middle    Antenatal Testing  NST:  reactive  AFI:  12  Assessment:   Makayla Sullivan is a 43 y.o. [id number] at [redacted]w[redacted]d gestation.      Patient Active Problem List   Diagnosis   . 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   . Pregnancy with history of infertility, spontanous pregnancy.   . 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   . Hemorrhage after delivery of fetus, became very anemic.   Marland Kitchen History of pre-eclampsia in prior pregnancy, post partum. currently pregnant in third trimester   . AMA (advanced maternal age) multigravida age 42   . Esophageal reflux disease      Plan:   Plans for delivery: Vaginal anticipated. Induction 39 weeks 12/11/13  Beta strep culture: results reviewed,   negative.  Follow up in 1 Week.

## 2013-11-23 ENCOUNTER — Other Ambulatory Visit: Payer: Self-pay | Admitting: Specialist

## 2013-11-23 ENCOUNTER — Ambulatory Visit
Admission: RE | Admit: 2013-11-23 | Discharge: 2013-11-23 | Disposition: A | Discharge status: Home / Self Care | Payer: BLUE CROSS/BLUE SHIELD | Source: Ambulatory Visit

## 2013-11-23 ENCOUNTER — Ambulatory Visit
Admission: RE | Admit: 2013-11-23 | Discharge: 2013-11-23 | Disposition: A | Discharge status: Home / Self Care | Payer: BLUE CROSS/BLUE SHIELD | Source: Ambulatory Visit | Attending: Specialist | Admitting: Specialist

## 2013-11-23 ENCOUNTER — Ambulatory Visit: Payer: BLUE CROSS/BLUE SHIELD | Admitting: Specialist

## 2013-11-23 VITALS — BP 144/84 | Wt 172.0 lb

## 2013-11-23 DIAGNOSIS — Z3480 Encounter for supervision of other normal pregnancy, unspecified trimester: Secondary | ICD-10-CM | POA: Insufficient documentation

## 2013-11-23 DIAGNOSIS — O99891 Other specified diseases and conditions complicating pregnancy: Secondary | ICD-10-CM

## 2013-11-23 DIAGNOSIS — O09299 Supervision of pregnancy with other poor reproductive or obstetric history, unspecified trimester: Secondary | ICD-10-CM

## 2013-11-23 DIAGNOSIS — O0990 Supervision of high risk pregnancy, unspecified, unspecified trimester: Secondary | ICD-10-CM

## 2013-11-23 DIAGNOSIS — O0900 Supervision of pregnancy with history of infertility, unspecified trimester: Secondary | ICD-10-CM

## 2013-11-23 DIAGNOSIS — O10013 Pre-existing essential hypertension complicating pregnancy, third trimester: Secondary | ICD-10-CM

## 2013-11-23 DIAGNOSIS — O99345 Other mental disorders complicating the puerperium: Secondary | ICD-10-CM

## 2013-11-23 DIAGNOSIS — F3289 Other specified depressive episodes: Secondary | ICD-10-CM

## 2013-11-23 DIAGNOSIS — O09293 Supervision of pregnancy with other poor reproductive or obstetric history, third trimester: Secondary | ICD-10-CM

## 2013-11-23 DIAGNOSIS — O09529 Supervision of elderly multigravida, unspecified trimester: Secondary | ICD-10-CM

## 2013-11-23 DIAGNOSIS — O09899 Supervision of other high risk pregnancies, unspecified trimester: Secondary | ICD-10-CM

## 2013-11-23 DIAGNOSIS — O10019 Pre-existing essential hypertension complicating pregnancy, unspecified trimester: Secondary | ICD-10-CM

## 2013-11-23 DIAGNOSIS — F53 Postpartum depression: Secondary | ICD-10-CM

## 2013-11-23 DIAGNOSIS — K219 Gastro-esophageal reflux disease without esophagitis: Secondary | ICD-10-CM

## 2013-11-23 DIAGNOSIS — O0903 Supervision of pregnancy with history of infertility, third trimester: Secondary | ICD-10-CM

## 2013-11-23 DIAGNOSIS — O09523 Supervision of elderly multigravida, third trimester: Secondary | ICD-10-CM

## 2013-11-23 LAB — POCT URINALYSIS DIPSTIX (10)(MULTI-TEST)
Bilirubin, UA POCT: NEGATIVE
Blood, UA POCT: NEGATIVE
Glucose, UA POCT: NEGATIVE mg/dL
Ketones, UA POCT: NEGATIVE mg/dL
Nitrite, UA POCT: NEGATIVE
POCT Leukocytes, UA: NEGATIVE
POCT Spec Gravity, UA: 1.01 (ref 1.001–1.035)
POCT pH, UA: 6 (ref 5–8)
Urobilinogen, UA: 0.2 mg/dL

## 2013-11-23 NOTE — Progress Notes (Signed)
Subjective:   Ms. Garrow is a 43 y.o. female being seen today for her obstetrical visit. She is [id number] at [redacted]w[redacted]d gestation. Patient reports no complaints. Fetal movement: normal.    Estimated Date of Delivery: 12/11/13    The following portions of the patient's history were reviewed and updated as appropriate: allergies, current medications, past family history, past medical history, past social history, past surgical history and problem list.    Review of Systems  A comprehensive review of systems was negative.  Objective:   LMP 03/06/2013  FHT:  145 BPM  Uterine Size:  size equals dates  Presentation:  vertex  Pelvic Exam:          Dilation: Closed   Effacement: 25%          Station:  Floating            Consistency: medium        Position: middle    Antenatal Testing  BPP:  8/8  AFI:  15.3  1) Single intrauterine pregnancy in vertex presentation.  2) Estimated fetal weight is 2850 grams, at the 36th percentile, within normal limits.  3) Amniotic fluid index is  15.3 cm, within normal limits.  4) Targeted anatomy survey was not performed.  Assessment:   Ms. Storts is a 43 y.o. [id number] at [redacted]w[redacted]d gestation.      Patient Active Problem List   Diagnosis   . 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   . Pregnancy with history of infertility, spontanous pregnancy.   . 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   . Hemorrhage after delivery of fetus, became very anemic.   Marland Kitchen History of pre-eclampsia in prior pregnancy, post partum. currently pregnant in third trimester   . AMA (advanced maternal age) multigravida age 40   . Esophageal reflux disease      Plan:   Plans for delivery: Vaginal anticipated. 12/11/13  Beta strep culture: results reviewed,   negative.  Follow up in 1 Week.

## 2013-11-30 ENCOUNTER — Ambulatory Visit
Admission: RE | Admit: 2013-11-30 | Discharge: 2013-11-30 | Disposition: A | Discharge status: Home / Self Care | Payer: BLUE CROSS/BLUE SHIELD | Source: Ambulatory Visit | Attending: Specialist | Admitting: Specialist

## 2013-11-30 ENCOUNTER — Ambulatory Visit: Payer: BLUE CROSS/BLUE SHIELD | Admitting: Specialist

## 2013-11-30 ENCOUNTER — Ambulatory Visit
Admission: RE | Admit: 2013-11-30 | Discharge: 2013-11-30 | Disposition: A | Discharge status: Home / Self Care | Payer: BLUE CROSS/BLUE SHIELD | Source: Ambulatory Visit

## 2013-11-30 VITALS — BP 143/78 | Wt 174.0 lb

## 2013-11-30 DIAGNOSIS — O09529 Supervision of elderly multigravida, unspecified trimester: Secondary | ICD-10-CM

## 2013-11-30 DIAGNOSIS — K219 Gastro-esophageal reflux disease without esophagitis: Secondary | ICD-10-CM

## 2013-11-30 DIAGNOSIS — O09899 Supervision of other high risk pregnancies, unspecified trimester: Secondary | ICD-10-CM

## 2013-11-30 DIAGNOSIS — O99345 Other mental disorders complicating the puerperium: Secondary | ICD-10-CM

## 2013-11-30 DIAGNOSIS — O10019 Pre-existing essential hypertension complicating pregnancy, unspecified trimester: Secondary | ICD-10-CM

## 2013-11-30 DIAGNOSIS — O09293 Supervision of pregnancy with other poor reproductive or obstetric history, third trimester: Secondary | ICD-10-CM

## 2013-11-30 DIAGNOSIS — F3289 Other specified depressive episodes: Secondary | ICD-10-CM

## 2013-11-30 DIAGNOSIS — O10013 Pre-existing essential hypertension complicating pregnancy, third trimester: Secondary | ICD-10-CM

## 2013-11-30 DIAGNOSIS — O09299 Supervision of pregnancy with other poor reproductive or obstetric history, unspecified trimester: Secondary | ICD-10-CM

## 2013-11-30 DIAGNOSIS — F53 Postpartum depression: Secondary | ICD-10-CM

## 2013-11-30 DIAGNOSIS — O0903 Supervision of pregnancy with history of infertility, third trimester: Secondary | ICD-10-CM

## 2013-11-30 DIAGNOSIS — O9989 Other specified diseases and conditions complicating pregnancy, childbirth and the puerperium: Secondary | ICD-10-CM

## 2013-11-30 DIAGNOSIS — O0900 Supervision of pregnancy with history of infertility, unspecified trimester: Secondary | ICD-10-CM

## 2013-11-30 DIAGNOSIS — O0990 Supervision of high risk pregnancy, unspecified, unspecified trimester: Secondary | ICD-10-CM | POA: Insufficient documentation

## 2013-11-30 DIAGNOSIS — O09523 Supervision of elderly multigravida, third trimester: Secondary | ICD-10-CM

## 2013-11-30 LAB — POCT URINALYSIS DIPSTIX (10)(MULTI-TEST)
Bilirubin, UA POCT: NEGATIVE
Blood, UA POCT: NEGATIVE
Glucose, UA POCT: NEGATIVE mg/dL
Ketones, UA POCT: NEGATIVE mg/dL
Nitrite, UA POCT: NEGATIVE
POCT Leukocytes, UA: NEGATIVE
POCT Spec Gravity, UA: 1.01 (ref 1.001–1.035)
POCT pH, UA: 6 (ref 5–8)
Urobilinogen, UA: 0.2 mg/dL

## 2013-11-30 NOTE — Progress Notes (Signed)
Subjective:   Ms. Chauca is a 43 y.o. female being seen today for her obstetrical visit. She is [id number] at [redacted]w[redacted]d gestation. Patient reports no complaints. Fetal movement: normal.    Estimated Date of Delivery: 12/11/13    The following portions of the patient's history were reviewed and updated as appropriate: allergies, current medications, past family history, past medical history, past social history, past surgical history and problem list.    Review of Systems  A comprehensive review of systems was negative.  Objective:   LMP 03/06/2013  FHT:  136 BPM  Uterine Size:  size equals dates  Presentation:  vertex  Pelvic Exam:          Dilation: 1cm   Effacement: 25%          Station:  Floating            Consistency: firm        Position: middle    Antenatal Testing  NST:  reactive AFI 15  Assessment:   Ms. Stroh is a 43 y.o. [id number] at [redacted]w[redacted]d gestation.      Patient Active Problem List   Diagnosis   . 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   . Pregnancy with history of infertility, spontanous pregnancy.   . 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   . Hemorrhage after delivery of fetus, became very anemic.   Marland Kitchen History of pre-eclampsia in prior pregnancy, post partum. currently pregnant in third trimester   . AMA (advanced maternal age) multigravida age 41   . Esophageal reflux disease      Plan:   Plans for delivery: Vaginal anticipated.  Beta strep culture: results reviewed,   Negative  Cytotec 2/22 induction 2/23

## 2013-12-03 ENCOUNTER — Encounter (HOSPITAL_BASED_OUTPATIENT_CLINIC_OR_DEPARTMENT_OTHER): Payer: Self-pay

## 2013-12-03 ENCOUNTER — Inpatient Hospital Stay
Admission: AD | Admit: 2013-12-03 | Discharge: 2013-12-05 | DRG: 774 | Disposition: A | Discharge status: Home / Self Care | Payer: BLUE CROSS/BLUE SHIELD | Source: Ambulatory Visit | Attending: Specialist | Admitting: Specialist

## 2013-12-03 ENCOUNTER — Inpatient Hospital Stay: Payer: BLUE CROSS/BLUE SHIELD

## 2013-12-03 ENCOUNTER — Inpatient Hospital Stay (HOSPITAL_BASED_OUTPATIENT_CLINIC_OR_DEPARTMENT_OTHER): Payer: BLUE CROSS/BLUE SHIELD | Admitting: Specialist

## 2013-12-03 DIAGNOSIS — Z3480 Encounter for supervision of other normal pregnancy, unspecified trimester: Secondary | ICD-10-CM

## 2013-12-03 DIAGNOSIS — O09293 Supervision of pregnancy with other poor reproductive or obstetric history, third trimester: Secondary | ICD-10-CM | POA: Diagnosis present

## 2013-12-03 DIAGNOSIS — O09529 Supervision of elderly multigravida, unspecified trimester: Secondary | ICD-10-CM | POA: Diagnosis present

## 2013-12-03 DIAGNOSIS — O10919 Unspecified pre-existing hypertension complicating pregnancy, unspecified trimester: Secondary | ICD-10-CM | POA: Diagnosis present

## 2013-12-03 DIAGNOSIS — O1002 Pre-existing essential hypertension complicating childbirth: Principal | ICD-10-CM | POA: Diagnosis present

## 2013-12-03 DIAGNOSIS — F53 Postpartum depression: Secondary | ICD-10-CM | POA: Diagnosis present

## 2013-12-03 HISTORY — DX: Reserved for concepts with insufficient information to code with codable children: IMO0002

## 2013-12-03 HISTORY — DX: Pregnant state, incidental: Z33.1

## 2013-12-03 HISTORY — DX: Postpartum depression: F53.0

## 2013-12-03 HISTORY — DX: Supervision of pregnancy with other poor reproductive or obstetric history, unspecified trimester: O09.299

## 2013-12-03 LAB — GFR: EGFR: >60

## 2013-12-03 LAB — PREGNANCY INDUCED HYPERTENSION PANEL
ALT: 13 U/L (ref 0–55)
AST (SGOT): 19 U/L (ref 5–34)
Creatinine: 0.6 mg/dL (ref 0.6–1.0)
LDH: 239 U/L (ref 125–331)
Uric acid: 3.4 mg/dL (ref 2.6–6.0)

## 2013-12-03 LAB — CBC AND DIFFERENTIAL
Basophils Absolute Automated: 0.02 10*3/uL (ref 0.00–0.20)
Basophils Automated: 0 %
Eosinophils Absolute Automated: 0.04 10*3/uL (ref 0.00–0.70)
Eosinophils Automated: 0 %
Hematocrit: 36.2 % — ABNORMAL LOW (ref 37.0–47.0)
Hgb: 12.1 g/dL (ref 12.0–16.0)
Immature Granulocytes Absolute: 0.03 10*3/uL
Immature Granulocytes: 0 %
Lymphocytes Absolute Automated: 2.09 10*3/uL (ref 0.50–4.40)
Lymphocytes Automated: 21 %
MCH: 28.9 pg (ref 28.0–32.0)
MCHC: 33.4 g/dL (ref 32.0–36.0)
MCV: 86.4 fL (ref 80.0–100.0)
MPV: 12.9 fL — ABNORMAL HIGH (ref 9.4–12.3)
Monocytes Absolute Automated: 0.75 10*3/uL (ref 0.00–1.20)
Monocytes: 7 %
Neutrophils Absolute: 7.2 10*3/uL (ref 1.80–8.10)
Neutrophils: 71 %
Nucleated RBC: 0 (ref 0–1)
Platelets: 197 10*3/uL (ref 140–400)
RBC: 4.19 10*6/uL — ABNORMAL LOW (ref 4.20–5.40)
RDW: 14 % (ref 12–15)
WBC: 10.13 10*3/uL (ref 3.50–10.80)

## 2013-12-03 LAB — PT AND APTT
PT INR: 1 (ref 0.9–1.1)
PT: 12.7 s (ref 12.6–15.0)
PTT: 26 s (ref 23–37)

## 2013-12-03 LAB — TYPE AND SCREEN
AB Screen Gel: NEGATIVE
ABO Rh: A POS

## 2013-12-03 LAB — FIBRINOGEN: Fibrinogen: 495 mg/dL — ABNORMAL HIGH (ref 189–458)

## 2013-12-03 MED ORDER — MISOPROSTOL VAGINAL SUPPOSITORY 35 MCG
35.0000 ug | Status: DC | PRN
Start: 2013-12-03 — End: 2013-12-04
  Administered 2013-12-03 – 2013-12-04 (×3): 35 ug via VAGINAL

## 2013-12-03 MED ORDER — CITRIC ACID-SODIUM CITRATE 334-500 MG/5ML PO SOLN
30.0000 mL | Freq: Once | ORAL | Status: DC | PRN
Start: 2013-12-03 — End: 2013-12-04

## 2013-12-03 MED ORDER — ESCITALOPRAM OXALATE 10 MG PO TABS
10.0000 mg | ORAL_TABLET | Freq: Every evening | ORAL | Status: DC
Start: 2013-12-03 — End: 2013-12-05
  Administered 2013-12-03 – 2013-12-04 (×2): 10 mg via ORAL
  Filled 2013-12-03 (×3): qty 1

## 2013-12-03 MED ORDER — ZOLPIDEM TARTRATE 5 MG PO TABS
5.0000 mg | ORAL_TABLET | Freq: Every evening | ORAL | Status: DC | PRN
Start: 2013-12-03 — End: 2013-12-04
  Administered 2013-12-03 – 2013-12-04 (×2): 5 mg via ORAL
  Filled 2013-12-03: qty 2

## 2013-12-03 MED ORDER — SODIUM CHLORIDE 0.9 % IJ SOLN
3.0000 mL | Freq: Three times a day (TID) | INTRAMUSCULAR | Status: DC
Start: 2013-12-03 — End: 2013-12-04

## 2013-12-03 MED ORDER — LACTATED RINGERS IV SOLN
INTRAVENOUS | Status: DC
Start: 2013-12-03 — End: 2013-12-05

## 2013-12-03 MED ORDER — ACETAMINOPHEN 325 MG PO TABS
650.0000 mg | ORAL_TABLET | Freq: Four times a day (QID) | ORAL | Status: DC | PRN
Start: 2013-12-03 — End: 2013-12-04

## 2013-12-03 MED ORDER — LACTATED RINGERS IV BOLUS
500.0000 mL | Freq: Once | INTRAVENOUS | Status: DC
Start: 2013-12-03 — End: 2013-12-04

## 2013-12-03 MED ORDER — ONDANSETRON HCL 4 MG/2ML IJ SOLN
8.0000 mg | Freq: Three times a day (TID) | INTRAMUSCULAR | Status: DC | PRN
Start: 2013-12-03 — End: 2013-12-04
  Administered 2013-12-04: 8 mg via INTRAVENOUS
  Filled 2013-12-03: qty 4

## 2013-12-03 MED ORDER — OXYTOCIN 30 UNITS IN LR 500 ML LABOR -OUTSOURCED
2.0000 m[IU]/min | INTRAVENOUS | Status: DC
Start: 2013-12-04 — End: 2013-12-05
  Administered 2013-12-04: 400 m[IU]/min via INTRAVENOUS
  Administered 2013-12-04: 2 m[IU]/min via INTRAVENOUS
  Filled 2013-12-03 (×2): qty 500

## 2013-12-03 MED ORDER — TERBUTALINE SULFATE 1 MG/ML IJ SOLN
0.2500 mg | Freq: Once | INTRAMUSCULAR | Status: DC | PRN
Start: 2013-12-03 — End: 2013-12-04

## 2013-12-03 NOTE — H&P (Signed)
ADMISSION HISTORY AND PHYSICAL EXAM    Date Time: 12/03/2013 9:56 PM  Patient Name: Makayla Sullivan  Attending Physician: Manuela Schwartz, MD    CC: Chronic Hypertension (well controlled) with history of PP PEC, PP hemorrhage and PP depression in prior pregnancy    History of Presenting Illness:   Patient is a 43 y.o. G3 P0111 presenting @ [redacted]w[redacted]d (Estimated Date of Delivery: 12/11/13) with Chronic hypertension in pregnancy.  This evening the patient reports feeling well.  She denies si/sx of PEC (no HA/blurry vision/SOB/RUQ pain).  She denies vaginal bleeding and denies leakage of fluid, but reports passage of her mucous plug following her exam on Thursday.  She denies contractions and affirms active fetal movement.    Past Medical History:     Past Medical History   Diagnosis Date   . Infertility    . Hypertension      During Pregnancy   . Mental disorder      Depression on Lexapro   . 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 07/27/2013   . Anemia        Past Surgical History:     Past Surgical History   Procedure Date   . No past surgeries        OB History:     OB History   Gravida Para Term Preterm AB SAB TAB Ectopic Multiple Living   3 1  1 1 1    1       # Outcome Date GA Lbr Len/2nd Weight Sex Delivery Anes PTL Lv   3 CUR            2 PRE 08/29/11 [redacted]w[redacted]d / 00:23 2.605 kg (5 lb 11.9 oz) M SVD Local Y Y      Comments: PPROM at 36.6 weeks Post partum Hem and severe anemia followed by severe post Partum depression and Post partum pre-eclempsia.   1 SAB                 Patient's last menstrual period was 03/06/2013.  Estimated Date of Delivery: 12/11/13    Medications / Herbals / OTC:     Prescriptions prior to admission   Medication Sig   . budesonide (RINOCORT AQUA) 32 MCG/ACT nasal spray 1 spray by Nasal route daily. Dose is for each nostril.   Marland Kitchen escitalopram (LEXAPRO) 10 MG tablet Take 10 mg by mouth daily.   . pantoprazole (PROTONIX) 20 MG tablet Take 20 mg by mouth daily.   . Prenatal Vit-Fe Fumarate-FA  (PRENATAL MULTIVITAMIN) 60-1 MG tablet Take 1 tablet by mouth daily.     Marland Kitchen aspirin 81 MG chewable tablet Chew 81 mg by mouth daily.         Allergies:   No Known Allergies      Psychosocial / Family History:     History     Social History   . Marital Status: Married     Spouse Name: N/A     Number of Children: N/A   . Years of Education: N/A     Social History Main Topics   . Smoking status: Former Games developer   . Smokeless tobacco: Not on file   . Alcohol Use: No   . Drug Use: No   . Sexually Active: Not Currently     Other Topics Concern   . Not on file     Social History Narrative   . No narrative on file       Family History  Problem Relation Age of Onset   . Hypertension Mother    . Hypothyroidism Mother    . Coronary artery disease Father    . Hypertension Brother    . Hypertension Maternal Grandmother    . Hyperlipidemia Maternal Grandmother    . Lung cancer Maternal Grandfather    . Colon cancer Maternal Grandfather    . Diabetes Paternal Grandmother    . Lung cancer Paternal Grandfather          Prenatal Labs:     Type & Rh  A POS  08/29/11       Antibody Screen  Neg  05/16/13       HCT  38.8 %  05/16/13       HGB  12.4  05/16/13       PLT  337 K/L  05/16/13       RPR  Nonreactive  05/16/13       Rubella  Immune  05/16/13       HBsAg  Negative  05/16/13       MSAFP  0.91  07/06/13       Gonorrhea  Negative  08/29/11       Chlamydia  Negative  08/29/11     HCT  34.1 %  09/11/13       HGB  11.4 g/dL  74/2/59       PLT  563 x10E3/uL  09/11/13       RPR  Non Reactive  09/11/13       Antibody Screen  Negative  09/11/13        Glucose Challenge  156 mg/dL  87/56/43       3 HR GTT  91/178/150/68  09/11/13       GBS    Negative     Physical Exam:     Filed Vitals:    12/03/13 2119   BP: 175/102   Pulse: 88   Temp:    Resp: 18     General appearance - alert, well appearing, and in no distress  Mental status - oriented to person, place, and time, normal mood, behavior, speech, motor activity, and thought processes  Chest - breathing  non-labored with normal inspiratory and expiratory effort  Heart - normal rate, regular rhythm  Abdomen - soft, gravid, nontender, nondistended, no rebound tenderness/guarding noted, Leopold's c/w cephalic presentation and EFW 7.5 lbs  Pelvic - 3 cm/50%/-3,   Extremities - feet normal, normal temperature and sensation, trace bipedal edema, no redness or tenderness in the calves or thighs, Homan's sign negative bilaterally  FHT: 120 bpm, reactive with moderate variability  Toco: ctx q 20 min    Assessment:   Patient is a 43 y.o. G3 P0111 @ [redacted]w[redacted]d with   Patient Active Problem List   Diagnosis   . 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   . Pregnancy with history of infertility, spontanous pregnancy.   . 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   . Hemorrhage after delivery of fetus, became very anemic.   Marland Kitchen History of pre-eclampsia in prior pregnancy, post partum. currently pregnant in third trimester   . AMA (advanced maternal age) multigravida age 68   . Esophageal reflux disease   . Chronic hypertension in pregnancy      Plan:   Admit in anticipation of  induction of labor for history of chronic hypertension with active expectant management for vaginal delivery   - pertinent labs and IVF, including PIH panel  and coags for elevated BP on admission, patient prior history of postpartum PEC and postpartum hemorrhage   - continuous EFM and TOCO   - Anesthesia C/S for epidural   -patient may have when desired   - No antibiotics indicated for GBS prophylaxis (neg GBS).   - Routine peripartum care   - Cytotec for cervical ripening   - plan for augmentation with AROM and/or Pitocin in AM/as indicated for labor progression    Reginia Forts, MD, MPH  MFM Fellow [facility]

## 2013-12-03 NOTE — Plan of Care (Signed)
Multip with a h/o pp hemorrhage (no transfusion) resulting in post partum anemia and post partum depression (already on Lexapro)

## 2013-12-04 ENCOUNTER — Encounter (HOSPITAL_BASED_OUTPATIENT_CLINIC_OR_DEPARTMENT_OTHER): Payer: Self-pay | Admitting: Anesthesiology

## 2013-12-04 ENCOUNTER — Encounter (HOSPITAL_BASED_OUTPATIENT_CLINIC_OR_DEPARTMENT_OTHER): Payer: Self-pay

## 2013-12-04 ENCOUNTER — Inpatient Hospital Stay (HOSPITAL_BASED_OUTPATIENT_CLINIC_OR_DEPARTMENT_OTHER): Payer: BLUE CROSS/BLUE SHIELD | Admitting: Anesthesiology

## 2013-12-04 DIAGNOSIS — O09529 Supervision of elderly multigravida, unspecified trimester: Secondary | ICD-10-CM

## 2013-12-04 DIAGNOSIS — O1002 Pre-existing essential hypertension complicating childbirth: Secondary | ICD-10-CM

## 2013-12-04 DIAGNOSIS — F3289 Other specified depressive episodes: Secondary | ICD-10-CM

## 2013-12-04 DIAGNOSIS — O99344 Other mental disorders complicating childbirth: Secondary | ICD-10-CM

## 2013-12-04 DIAGNOSIS — K219 Gastro-esophageal reflux disease without esophagitis: Secondary | ICD-10-CM

## 2013-12-04 MED ORDER — LANOLIN EX OINT
TOPICAL_OINTMENT | CUTANEOUS | Status: DC | PRN
Start: 2013-12-04 — End: 2013-12-05

## 2013-12-04 MED ORDER — BENZOCAINE-MENTHOL 20-0.5 % EX AERO
1.0000 | INHALATION_SPRAY | CUTANEOUS | Status: DC | PRN
Start: 2013-12-04 — End: 2013-12-05
  Administered 2013-12-04: 1 via TOPICAL
  Filled 2013-12-04: qty 56

## 2013-12-04 MED ORDER — FAMOTIDINE 10 MG/ML IV SOLN (WRAP)
20.0000 mg | Freq: Once | INTRAVENOUS | Status: DC | PRN
Start: 2013-12-04 — End: 2013-12-04

## 2013-12-04 MED ORDER — FENTANYL 2 MCG/ML+BUPIVACAINE 0.125% 100 ML
EPIDURAL | Status: AC
Start: 2013-12-04 — End: 2013-12-04
  Filled 2013-12-04: qty 100

## 2013-12-04 MED ORDER — ZOLPIDEM TARTRATE 5 MG PO TABS
5.0000 mg | ORAL_TABLET | Freq: Every evening | ORAL | Status: DC | PRN
Start: 2013-12-04 — End: 2013-12-05

## 2013-12-04 MED ORDER — HYDROCORTISONE 1 % EX OINT
TOPICAL_OINTMENT | Freq: Three times a day (TID) | CUTANEOUS | Status: DC | PRN
Start: 2013-12-04 — End: 2013-12-05

## 2013-12-04 MED ORDER — METHYLERGONOVINE MALEATE 0.2 MG PO TABS
0.2000 mg | ORAL_TABLET | Freq: Four times a day (QID) | ORAL | Status: AC | PRN
Start: 2013-12-04 — End: 2013-12-05

## 2013-12-04 MED ORDER — OXYCODONE-ACETAMINOPHEN 5-325 MG PO TABS
1.0000 | ORAL_TABLET | Freq: Once | ORAL | Status: DC | PRN
Start: 2013-12-04 — End: 2013-12-04

## 2013-12-04 MED ORDER — FENTANYL CITRATE 0.05 MG/ML IJ SOLN
INTRAMUSCULAR | Status: AC
Start: 2013-12-04 — End: 2013-12-04
  Filled 2013-12-04: qty 2

## 2013-12-04 MED ORDER — BUPIVACAINE HCL (PF) 0.25 % IJ SOLN
INTRAMUSCULAR | Status: DC | PRN
Start: 2013-12-04 — End: 2013-12-06
  Administered 2013-12-04: 10 mL via EPIDURAL

## 2013-12-04 MED ORDER — LACTATED RINGERS IV BOLUS
1000.0000 mL | Freq: Once | INTRAVENOUS | Status: AC
Start: 2013-12-04 — End: 2013-12-04
  Administered 2013-12-04: 1000 mL via INTRAVENOUS

## 2013-12-04 MED ORDER — MISOPROSTOL 200 MCG PO TABS
800.0000 ug | ORAL_TABLET | Freq: Once | ORAL | Status: AC
Start: 2013-12-04 — End: 2013-12-04
  Administered 2013-12-04: 800 ug via RECTAL

## 2013-12-04 MED ORDER — CITRIC ACID-SODIUM CITRATE 334-500 MG/5ML PO SOLN
30.0000 mL | Freq: Once | ORAL | Status: DC | PRN
Start: 2013-12-04 — End: 2013-12-04

## 2013-12-04 MED ORDER — BUPIVACAINE HCL (PF) 0.25 % IJ SOLN
30.0000 mL | Freq: Once | INTRAMUSCULAR | Status: DC
Start: 2013-12-04 — End: 2013-12-04
  Filled 2013-12-04: qty 30

## 2013-12-04 MED ORDER — IBUPROFEN 600 MG PO TABS
600.0000 mg | ORAL_TABLET | Freq: Once | ORAL | Status: AC | PRN
Start: 2013-12-04 — End: 2013-12-04

## 2013-12-04 MED ORDER — NALOXONE HCL 0.4 MG/ML IJ SOLN
0.1000 mg | INTRAMUSCULAR | Status: DC | PRN
Start: 2013-12-04 — End: 2013-12-04

## 2013-12-04 MED ORDER — METHYLERGONOVINE MALEATE 0.2 MG/ML IJ SOLN
200.0000 ug | Freq: Four times a day (QID) | INTRAMUSCULAR | Status: AC | PRN
Start: 2013-12-04 — End: 2013-12-05

## 2013-12-04 MED ORDER — OXYTOCIN 30UNITS IN LR 500 ML PP--OUTSOURCED
7.5000 [IU]/h | INTRAVENOUS | Status: AC
Start: 2013-12-04 — End: 2013-12-04

## 2013-12-04 MED ORDER — IBUPROFEN 600 MG PO TABS
ORAL_TABLET | ORAL | Status: AC
Start: 2013-12-04 — End: 2013-12-04
  Administered 2013-12-04: 600 mg via ORAL
  Filled 2013-12-04: qty 1

## 2013-12-04 MED ORDER — METOCLOPRAMIDE HCL 5 MG/ML IJ SOLN
10.0000 mg | Freq: Once | INTRAMUSCULAR | Status: DC | PRN
Start: 2013-12-04 — End: 2013-12-04

## 2013-12-04 MED ORDER — EPHEDRINE SULFATE 50 MG/ML IJ SOLN
10.0000 mg | Freq: Once | INTRAMUSCULAR | Status: DC | PRN
Start: 2013-12-04 — End: 2013-12-04

## 2013-12-04 MED ORDER — PRENATAL AD PO TABS
1.0000 | ORAL_TABLET | Freq: Every day | ORAL | Status: DC
Start: 2013-12-04 — End: 2013-12-05
  Administered 2013-12-04 – 2013-12-05 (×2): 1 via ORAL
  Filled 2013-12-04 (×2): qty 1

## 2013-12-04 MED ORDER — METHYLERGONOVINE MALEATE 0.2 MG/ML IJ SOLN
0.2000 mg | Freq: Once | INTRAMUSCULAR | Status: AC
Start: 2013-12-04 — End: 2013-12-04
  Administered 2013-12-04: 0.2 mg via INTRAMUSCULAR

## 2013-12-04 MED ORDER — EPHEDRINE SULFATE 50 MG/ML IJ SOLN
5.0000 mg | Freq: Once | INTRAMUSCULAR | Status: AC
Start: 2013-12-04 — End: 2013-12-04
  Administered 2013-12-04: 06:00:00 5 mg via INTRAVENOUS

## 2013-12-04 MED ORDER — IBUPROFEN 600 MG PO TABS
600.0000 mg | ORAL_TABLET | Freq: Four times a day (QID) | ORAL | Status: DC | PRN
Start: 2013-12-04 — End: 2013-12-05
  Administered 2013-12-04 – 2013-12-05 (×4): 600 mg via ORAL
  Filled 2013-12-04 (×4): qty 1

## 2013-12-04 MED ORDER — FENTANYL CITRATE 0.05 MG/ML IJ SOLN
INTRAMUSCULAR | Status: DC | PRN
Start: 2013-12-04 — End: 2013-12-06
  Administered 2013-12-04: 1.5 mL via INTRASPINAL

## 2013-12-04 MED ORDER — WITCH HAZEL-GLYCERIN EX PADS
1.0000 | MEDICATED_PAD | CUTANEOUS | Status: DC | PRN
Start: 2013-12-04 — End: 2013-12-05
  Administered 2013-12-04: 1 via TOPICAL
  Filled 2013-12-04: qty 40

## 2013-12-04 MED ORDER — OXYTOCIN 30UNITS IN LR 500 ML PP--OUTSOURCED
7.5000 [IU]/h | INTRAVENOUS | Status: DC
Start: 2013-12-04 — End: 2013-12-05
  Administered 2013-12-04: 7.5 [IU]/h via INTRAVENOUS

## 2013-12-04 MED ORDER — PROMETHAZINE HCL 25 MG/ML IJ SOLN
6.2500 mg | Freq: Once | INTRAMUSCULAR | Status: AC
Start: 2013-12-04 — End: 2013-12-04

## 2013-12-04 MED ORDER — PROMETHAZINE HCL 25 MG/ML IJ SOLN
INTRAMUSCULAR | Status: AC
Start: 2013-12-04 — End: 2013-12-04
  Administered 2013-12-04: 6.25 mg via INTRAVENOUS
  Filled 2013-12-04: qty 1

## 2013-12-04 MED ORDER — FENTANYL 2 MCG/ML+BUPIVACAINE 0.125% 100 ML
EPIDURAL | Status: DC
Start: 2013-12-04 — End: 2013-12-05
  Administered 2013-12-04: 10 mL/h via EPIDURAL
  Filled 2013-12-04: qty 100

## 2013-12-04 MED ORDER — FENTANYL CITRATE 0.05 MG/ML IJ SOLN
100.0000 ug | Freq: Once | INTRAMUSCULAR | Status: DC
Start: 2013-12-04 — End: 2013-12-04

## 2013-12-04 NOTE — Anesthesia Preprocedure Evaluation (Signed)
Anesthesia Evaluation    AIRWAY    Mallampati: III    TM distance: >3 FB  Neck ROM: full  Mouth Opening:full   CARDIOVASCULAR           DENTAL    No notable dental hx     PULMONARY         OTHER FINDINGS                      Anesthesia Plan    ASA 2     CSE                     Detailed anesthesia plan: CSE        Post op pain management: per surgeon    informed consent obtained      pertinent labs reviewed

## 2013-12-04 NOTE — Progress Notes (Signed)
[facility] VAGINAL DELIVERY NOTE    Delivery Diagnosis: 43 y.o. [redacted]w[redacted]d Chronic hypertension in pregnancy    Delivery Physician: Marta Lamas.    Type of Delivery: normal spontaneous vaginal delivery    Laceration:none    Episiotomy: No    Repair: No    Anesthesia: Epidural    Findings  Sex: female  Weight: 2930 grams  Apgar at 1 min: 8  Apgar at % min: 9    Placenta: spontaneous    Cord: none    EBL:200 ml    Complications:none

## 2013-12-04 NOTE — Anesthesia Procedure Notes (Signed)
Combined Spinal epidural    Patient location during procedure: L&DReason for block: maternal indication  Requested by Surgeon: Yes   End time: 12/04/2013 5:42 AM  Staffing  Anesthesiologist: Gery Pray  Performed by: anesthesiologist Preanesthetic Checklist Completed: patient identified, pre-op evaluation, risks and benefits discussed and anesthesia consent given    CSE  Patient monitoring: Pulse oximetry and NIBP  Premedication: Meaningful Contact Maintained and No    Patient position: sitting  Sterile Technique: Betadine, sterile drape, mask  and sterile gloves  Skin Local: other (Bupivacaine 0.5%)    Number of attempts: 1    Approach: midline    Injection technique: LOR air  Needle type: Touhy needle     Needle gauge: 19  CSF Return: No  Blood Return: No  Intrathecal space entered with Whitacre  Needle Gauge 27 G  CSF Return: Yes   Blood Return:No  Paresthesia Pain: no    Catheter type: end hole    Catheter size: 19g.    Catheter at skin depth: 13 cm  CSF Return: No  Blood Return: No  Test Dose:negative    Pressure Paresthesia: no  No Catheter IV/SA Signs or Symptoms    Assessment   Block Outcome: patient tolerated procedure well, no complications and pain improved

## 2013-12-04 NOTE — Progress Notes (Signed)
Epidural removed, tip intact.  

## 2013-12-04 NOTE — Consults (Signed)
43 yo [id number] with hx including HTN and PPD delivered son vaginally today at 10:08  GA [redacted] wks  BIrthweight 6 lb 7.4 oz    Initial consult.  Pt states she had difficulty with latch and supply with her last son. This son has latched well since birth.  Assisted patient with positioning in cross cradle and football positions. Baby maintains latch well.  Encouraged pt to bring in supply with exclusive breastfeeding as long as baby continues to maintain latch.    Encouraged skin to skin, and frequent unrestricted on cue breastfeeding at least every 3 hours.  Watch output and weight to be sure baby is getting enough.  Avoid formula and pacifier use unless medically indicated to encourage frequent breastfeeding practice and optimize supply.  Contact # given, call as needed.

## 2013-12-04 NOTE — Progress Notes (Signed)
12/04/13 @ 8 am  Cervix 3 cms 80% S-3 Scalp electrode applied  On 6 Mu pitocin Epidural in and is comfortable.  Tracing Category I    AK

## 2013-12-05 ENCOUNTER — Other Ambulatory Visit: Payer: Self-pay

## 2013-12-05 MED ORDER — ZOLPIDEM TARTRATE 5 MG PO TABS
5.0000 mg | ORAL_TABLET | Freq: Every evening | ORAL | 0 refills | Status: AC | PRN
Start: 2013-12-05 — End: ?
  Filled 2013-12-05: qty 5, 5d supply, fill #0

## 2013-12-05 MED ORDER — SENNOSIDES-DOCUSATE SODIUM 8.6-50 MG PO TABS
1.0000 | ORAL_TABLET | Freq: Every day | ORAL | 1 refills | Status: AC
Start: 2013-12-05 — End: ?
  Filled 2013-12-05: qty 100, 100d supply, fill #0

## 2013-12-05 MED ORDER — BREAST PUMP MISC
Status: AC
Start: 2013-12-05 — End: ?

## 2013-12-05 MED ORDER — IBUPROFEN 600 MG PO TABS
600.0000 mg | ORAL_TABLET | Freq: Four times a day (QID) | ORAL | 1 refills | Status: AC | PRN
Start: 2013-12-05 — End: ?
  Filled 2013-12-05: qty 40, 10d supply, fill #0

## 2013-12-05 NOTE — Discharge Instructions (Signed)
General Postpartum Vaginal Delivery Instructions     [facility] [address]  [place] [address]  [phone]                           *MAKE AN APPOINTMENT FOR A SIX WEEK CHECK UP WITH OUR OFFICE*                HIGH BLOOD PRESSURE/PRE-ECLAMPSIA:  You have risk for having symptoms of high blood pressure/pre-eclampsia for up to six weeks following your delivery (headache, vision changes, shortness of breath, persistent nausea/vomiting, e.g.).  If you experience these symptoms, please notify your doctor.  Because of this risk, you should continue to check your blood pressure at home at least 2 times daily and record these values so they can be reviewed with one of our nurses, if necessary in the first 2 weeks.  Please call your doctor sooner if your systolic pressure (top number) is higher than 160 and/or your diastolic pressure (bottom number) is higher than 100 on more than 3 consecutive occasions.  You may discuss resuming your blood pressure medication with your primary care doctor in 4-6 weeks.     ACTIVITIES:  When you arrive at home you may gradually resume your normal activities over a period of two weeks.  If you get tired, sit or lie down.  As your strength permits, you may increase your activities.      REST:  Try to get some sleep during the day when the baby sleeps.  This will help you survive the breast feeding/pumping at night.  Remember you're the only one who can breast feed for your baby.     STAIR CLIMBING:  Avoid stairs if possible for two weeks or limit trips to absolute necessity at a slow pace.     LIFTING: Even though you had a vaginal delivery, you should refrain from moving furniture or lifting any articles over 10-15 lbs. Such as groceries, laundry baskets, and removing wet wash from the washing machine.  You can gradually do more lifting 3-6 weeks following your delivery.     DRIVING:  Limit your driving to absolute necessity for the first  week following vaginal delivery.       EXERCISE:  Mild exercise such as walking following a vaginal delivery is excellent providing you do not tire yourself.      SEXUAL RELATIONS:  After vaginal delivery avoid intercourse as long as you have pain at your episiotomy site or you are having any bleeing or discharge.  The usual period to refrain from intercourse is about 4 weeks.  If you are nursing your baby and/or have not had a regular period and resume sexual relations, you still can become pregnant.  The physician will discuss birth control at your six postpartum visit.     HYGIENE:  You may take a shower/shampoo your hair at any time.               Perineal Care-carefully clean your vaginal area twice daily and after each bowel movement.  Remember wash from the front to the back to avoid contamination from the rectum.                 Breast Care - wash your breasts after breast feeding with warm water without soap.     DIET:  Concentrate on having a well-balanced daily diet liberal in protein foods such  as meat, cheese and eggs.  Eat fresh fruits and vegetables daily.  Whole grain breads and cereals are also valuable and help reduce constipation.  Drink 6-8 glasses of fluid daily (water, juices, skim milk).     BREAST CARE - NON NURSING MOTHERS:  Keep the nipples clean with plain warm water.  Wear a good, supportive bra to help prevent engorgement of breasts.  If the breasts do become full and engorged you may apply ice packs.  You may take two 500 mg tablets of Acetaminophen every 3 to 4 hours if you are uncomfortable. (i.e. Tylenol, Panadol, Datril). The other mediation you could use is Motrin/Ibuprofen 600mg  (take 1 tablet every 6 hours as needed).      PAIN MEDICATIONS: You will be given Ibuprofen while you are in the hospital.  When you get home you can also take Ibuprofen 600mg  every 6 to 8 hours for 3 to 4 days.  You should take your Ibuprofen around the clock, but do not take for more than 1 week.   You  are not to take this medication if you have a kidney problem.  ***Because you had an episiotomy or vaginal laceration that required repair, you have also been given a prescription for *** (Take 1 to 2 every 4 to 6 hours as needed), in addition to the Ibuprofen that was prescribed.  This medication can cause constipation, but is available for very intense pain after delivery. A stool softener has been provided to maintain soft bowel movements while you are taking this medication, ***which is very important for the type of tear that you had during your delivery.               BODY FUNCTIONS:     VAGINAL DISCHARGE:  Following delivery, you will have a discharge form ten days to three weeks.  The discharge may be dark brown, red, whitish pink or yellow and gradually will become lighter in color and less in amount.  If there is a foul odor or heavy discharge, notify the doctor.   During your menses (period) or duration of discharge, wear a sanitary pad rather than a tampon.  Foreign material in the vagina may cause an infection.      MENSTRUATION: The length of time before your menstrual periods return is unpredictable.  The range is four to twelve weeks, and up to sixteen weeks or longer if you are breast feeding.  Do not be alarmed if your first two or three periods are heavy, irregular and prolonged.  You also should know that you can become pregnant even though you have not had a menstrual period.     BOWEL MOVEMENTS:  Regularity of bowel movements is most important.  Constipation should be avoided.  Do not take harsh laxatives.  You may use Citrucel or Metamusil ***or the stool softener provided to ensure that you continue to be regular.      SPECIAL INSTRUCTIONS FOR EPISIOTOMY CARE:  Rinse the perineal area from the front to back after urination.  This will keep the area clean and help prevent infection.  The episiotomy will heal in three to six weeks, and the stitches used will be absorbed.  Warm sitz baths will  help to relieve some of the discomfort.                   POSTPARTUM ADVICE:    CONSULT THE DOCTOR FOR ANY OF THE FOLLOWING:    If your vaginal discharge  becomes excessive or has a foul odor.  ***If you feel that the area of your vagina that was repaired is more painful or has opened up.  If you have abdominal pain other than minor cramping during the first week at home.  If you have urinary frequency or burning on urination.  If you have chills, aching feeling, check you temperature and if over 100.4, call your physician.  If you have soreness or redness in the legs or an area of your leg feels hot or tight.  If you have continued soreness or see a reddened area on your breast(s).  If you feel down, depressed, hopeless and/or have thoughts of harming yourself or your infant.

## 2013-12-05 NOTE — Plan of Care (Signed)
Patient is stable and without questions or concerns at this time.  POC reviewed this shift and patient verbalizes understanding and continues to progress.

## 2013-12-06 NOTE — Anesthesia Postprocedure Evaluation (Signed)
Anesthesia Post Evaluation    Patient: Makayla Sullivan    Procedures performed: * No procedures listed *    Anesthesia type: Epidural    This is an addendum note.  No problems noted in the chart.

## 2014-01-25 ENCOUNTER — Ambulatory Visit: Payer: BLUE CROSS/BLUE SHIELD | Admitting: Obstetrics & Gynecology

## 2014-01-25 NOTE — Progress Notes (Signed)
Subjective:       Makayla Sullivan is a 43 y.o. female who presents for a postpartum visit. She is 51 days postpartum following a spontaneous vaginal delivery. I have fully reviewed the prenatal and intrapartum course. The delivery was at [redacted]w[redacted]d. Outcome: spontaneous vaginal delivery. Anesthesia: epidural. Postpartum course has been uncomplicated. Her pregnancy was complicated by [facility]. She states she stopped taking her medication but her pressures have been under 130s/80s. Baby's course has been uncomplicated. Baby is feeding by breast and bottle. Bleeding no bleeding. Bowel function is normal. Bladder function is normal. Patient is not sexually active. Contraception method is none. Postpartum depression screening value was 9.    The following portions of the patient's history were reviewed and updated as appropriate: allergies, current medications, past family history, past medical history, past social history, past surgical history and problem list.    Review of Systems  Pertinent items are noted in HPI.     Objective:      BP 138/80  LMP 03/06/2013   General:  alert, appears stated age and cooperative    Breasts:  inspection negative, no nipple discharge or bleeding, no masses or nodularity palpable   Lungs: clear to auscultation bilaterally   Heart:  regular rate and rhythm, S1, S2 normal, no murmur, click, rub or gallop   Abdomen: soft, non-tender; bowel sounds normal; no masses,  no organomegaly    Vulva:  normal   Vagina: normal vagina, no discharge, exudate, lesion, or erythema   Cervix:  no lesions   Corpus: normal size, contour, position, consistency, mobility, non-tender   Adnexa:  normal adnexa   Rectal Exam: normal          Assessment:      6 weeks postpartum exam. Pap smear not done at today's visit.     Plan:      1. Contraception: vasectomy. Husband plans vasectomy in the next few weeks. Will use barrier methods prior.  2. Follow up with primary MD for HTN  3. Follow up as needed.   4. Mood is stable,  states coping well. Discussed postpartum support.     Benjamin Stain MD

## 2014-03-01 ENCOUNTER — Encounter (HOSPITAL_BASED_OUTPATIENT_CLINIC_OR_DEPARTMENT_OTHER): Payer: Self-pay

## 2014-07-15 ENCOUNTER — Encounter (HOSPITAL_COMMUNITY): Payer: Self-pay | Admitting: Emergency Medicine

## 2014-07-15 ENCOUNTER — Emergency Department (HOSPITAL_COMMUNITY)
Admission: EM | Admit: 2014-07-15 | Discharge: 2014-07-15 | Disposition: A | Discharge status: Home / Self Care | Payer: Federal, State, Local not specified - PPO | Attending: Emergency Medicine | Admitting: Emergency Medicine

## 2014-07-15 ENCOUNTER — Emergency Department (HOSPITAL_COMMUNITY): Payer: Federal, State, Local not specified - PPO

## 2014-07-15 DIAGNOSIS — Z79899 Other long term (current) drug therapy: Secondary | ICD-10-CM | POA: Insufficient documentation

## 2014-07-15 DIAGNOSIS — Z7951 Long term (current) use of inhaled steroids: Secondary | ICD-10-CM | POA: Diagnosis not present

## 2014-07-15 DIAGNOSIS — K219 Gastro-esophageal reflux disease without esophagitis: Secondary | ICD-10-CM | POA: Diagnosis not present

## 2014-07-15 DIAGNOSIS — R1032 Left lower quadrant pain: Secondary | ICD-10-CM | POA: Diagnosis not present

## 2014-07-15 DIAGNOSIS — Z3202 Encounter for pregnancy test, result negative: Secondary | ICD-10-CM | POA: Diagnosis not present

## 2014-07-15 DIAGNOSIS — R1013 Epigastric pain: Secondary | ICD-10-CM | POA: Diagnosis present

## 2014-07-15 DIAGNOSIS — Z880 Allergy status to penicillin: Secondary | ICD-10-CM | POA: Insufficient documentation

## 2014-07-15 DIAGNOSIS — R110 Nausea: Secondary | ICD-10-CM

## 2014-07-15 HISTORY — DX: Gastro-esophageal reflux disease without esophagitis: K21.9

## 2014-07-15 HISTORY — DX: Unspecified pre-eclampsia, unspecified trimester: O14.90

## 2014-07-15 LAB — CBC WITH DIFFERENTIAL/PLATELET
Basophils Absolute: 0 10*3/uL (ref 0.0–0.1)
Basophils Relative: 0 % (ref 0–1)
Eosinophils Absolute: 0.1 10*3/uL (ref 0.0–0.7)
Eosinophils Relative: 2 % (ref 0–5)
HCT: 40.7 % (ref 36.0–46.0)
HEMOGLOBIN: 13.7 g/dL (ref 12.0–15.0)
LYMPHS ABS: 1.6 10*3/uL (ref 0.7–4.0)
LYMPHS PCT: 20 % (ref 12–46)
MCH: 28.8 pg (ref 26.0–34.0)
MCHC: 33.7 g/dL (ref 30.0–36.0)
MCV: 85.5 fL (ref 78.0–100.0)
Monocytes Absolute: 0.6 10*3/uL (ref 0.1–1.0)
Monocytes Relative: 8 % (ref 3–12)
NEUTROS PCT: 70 % (ref 43–77)
Neutro Abs: 5.6 10*3/uL (ref 1.7–7.7)
Platelets: 243 10*3/uL (ref 150–400)
RBC: 4.76 MIL/uL (ref 3.87–5.11)
RDW: 12.7 % (ref 11.5–15.5)
WBC: 7.9 10*3/uL (ref 4.0–10.5)

## 2014-07-15 LAB — URINALYSIS, ROUTINE W REFLEX MICROSCOPIC
BILIRUBIN URINE: NEGATIVE
GLUCOSE, UA: NEGATIVE mg/dL
Hgb urine dipstick: NEGATIVE
KETONES UR: 15 mg/dL — AB
Leukocytes, UA: NEGATIVE
Nitrite: NEGATIVE
PROTEIN: NEGATIVE mg/dL
Specific Gravity, Urine: 1.02 (ref 1.005–1.030)
UROBILINOGEN UA: 0.2 mg/dL (ref 0.0–1.0)
pH: 6.5 (ref 5.0–8.0)

## 2014-07-15 LAB — POC URINE PREG, ED: Preg Test, Ur: NEGATIVE

## 2014-07-15 LAB — COMPREHENSIVE METABOLIC PANEL
ALK PHOS: 33 U/L — AB (ref 39–117)
ALT: 18 U/L (ref 0–35)
ANION GAP: 14 (ref 5–15)
AST: 23 U/L (ref 0–37)
Albumin: 4.1 g/dL (ref 3.5–5.2)
BILIRUBIN TOTAL: 0.4 mg/dL (ref 0.3–1.2)
BUN: 11 mg/dL (ref 6–23)
CO2: 24 meq/L (ref 19–32)
Calcium: 9.1 mg/dL (ref 8.4–10.5)
Chloride: 100 mEq/L (ref 96–112)
Creatinine, Ser: 0.71 mg/dL (ref 0.50–1.10)
GLUCOSE: 100 mg/dL — AB (ref 70–99)
POTASSIUM: 3.6 meq/L — AB (ref 3.7–5.3)
Sodium: 138 mEq/L (ref 137–147)
Total Protein: 7.7 g/dL (ref 6.0–8.3)

## 2014-07-15 LAB — LIPASE, BLOOD: Lipase: 26 U/L (ref 11–59)

## 2014-07-15 MED ORDER — IOHEXOL 300 MG/ML  SOLN
25.0000 mL | INTRAMUSCULAR | Status: DC | PRN
  Administered 2014-07-15: 25 mL via ORAL

## 2014-07-15 MED ORDER — ONDANSETRON HCL 4 MG/2ML IJ SOLN
4.0000 mg | Freq: Once | INTRAMUSCULAR | Status: AC
  Administered 2014-07-15: 4 mg via INTRAVENOUS
  Filled 2014-07-15: qty 2

## 2014-07-15 MED ORDER — IOHEXOL 300 MG/ML  SOLN
100.0000 mL | Freq: Once | INTRAMUSCULAR | Status: AC | PRN
  Administered 2014-07-15: 100 mL via INTRAVENOUS

## 2014-07-15 MED ORDER — ONDANSETRON 4 MG PO TBDP: 4.0000 mg | ORAL_TABLET | Freq: Three times a day (TID) | ORAL | Status: DC | PRN

## 2014-07-15 NOTE — ED Notes (Signed)
Pt reports intermittent abd pain x 4 days with nausea and diarrhea. "feels like a band around her abd."

## 2014-07-15 NOTE — ED Provider Notes (Signed)
Care taken over from A. Harris PA-C at 4:17 PM  Pt p/w epigastric pain x 4 days.  Worsened this AM.  Pt is high risk for GB etiology but neg murphy's. Pt has pain worst in LLQ.  CT ordered, if neg, will neg RUQ US.     Physical Exam  BP 133/86  Pulse 82  Temp(Src) 97.7 F (36.5 C) (Oral)  Resp 15  SpO2 99%  LMP 07/11/2014  Physical Exam  ED Course  Procedures  CT Abdomen Pelvis W Contrast (Final result)  Result time: 07/15/14 17:18:29    Final result by Rad Results In Interface (07/15/14 17:18:29)    Narrative:   CLINICAL DATA: Initial presentation for right upper quadrant pain extending to the back. Epigastric pain. Six months postpartum. The physician order states left lower quadrant pain.  EXAM: CT ABDOMEN AND PELVIS WITH CONTRAST  TECHNIQUE: Multidetector CT imaging of the abdomen and pelvis was performed using the standard protocol following bolus administration of intravenous contrast.  CONTRAST: 100mL OMNIPAQUE IOHEXOL 300 MG/ML SOLN  COMPARISON: Acute abdominal series 07/15/2014.  FINDINGS: The lung bases are clear without focal nodule, mass, or airspace disease. The heart size is normal. No significant pleural or pericardial effusion is present.  The liver and spleen are within normal limits. Stomach, duodenum, and pancreas are unremarkable. The common bile duct and gallbladder are within normal limits. The adrenal glands are normal bilaterally. The kidneys are within normal limits.  The rectosigmoid colon is within normal limits. The remainder of the colon is unremarkable. The appendix is visualized and normal. The uterus is mildly edematous. Likely corresponding to the patient's cycle. There is some fluid surrounding the right adnexa and extending into the anatomic pelvis, likely physiologic. No other significant free fluid or adenopathy is present.  Bone windows demonstrate no focal lytic or blastic lesions.  IMPRESSION: 1. No acute or focal  lesion to explain the patient's symptoms. 2. Fluid surrounding the right ovary and into the anatomic pelvis is likely physiologic. This may be related to recent follicle rupture.       MDM Imaging is HEENT as above; for ovarian cyst. On reexam the patient has no pain in her lower quadrants, Murphy's is still negative. Patient states that she feels pain-free and 100% better following the Zofran. Patient also states she's had diarrhea since may be a viral gastroenteritis. Given the normal imaging and absence of pain in the right upper quadrant it is felt that an ultrasound this time is not indicated and if symptoms continue in outpatient RUQ ultrasound may be necessary. Patient discharged with prescription for Zofran.      Beverely Risenennis Leveon Pelzer, MD 07/15/14 [phone]

## 2014-07-15 NOTE — ED Notes (Signed)
Remains in CT

## 2014-07-15 NOTE — ED Notes (Signed)
Returned from Radiology.

## 2014-07-15 NOTE — ED Notes (Signed)
Pt drinking PO contrast

## 2014-07-15 NOTE — ED Notes (Signed)
MD at bedside. 

## 2014-07-15 NOTE — ED Provider Notes (Signed)
CSN: [id number]     Arrival date & time 07/15/14  1343 History   First MD Initiated Contact with Patient 07/15/14 1400     Chief Complaint  Patient presents with  . Abdominal Pain     (Consider location/radiation/quality/duration/timing/severity/associated sxs/prior Treatment) HPI  Melinda [name] is a(n) 43 y.o. female who presents to the ED with cc of abdominal pain.  The patient is S/P normal vaginal delivery of a healthy child 6 months ago. She has had intermittent epigastric abdominal pain that radiates around to her back BL in the distribution of her diaphragm. It has been intermittent and worsening over the past 34 days. She feels associated pressure. This morning, 1 hour after she ate breakfast she developed severe pain and nausea with retching. She has had some tenesmus and feeling as if she needs to defecate without diarrhea or constipation.She denies any respiratory or urinary sxs. She denies CP or SOB. The patient has a family history of mother with acute cholecysitis. She is fair, female, 37, overweight, and postpartum.  Past Medical History  Diagnosis Date  . Acid reflux   . Preeclampsia    History reviewed. No pertinent past surgical history. History reviewed. No pertinent family history. History  Substance Use Topics  . Smoking status: Never Smoker   . Smokeless tobacco: Not on file  . Alcohol Use: Yes     Comment: occ   OB History   Grav Para Term Preterm Abortions TAB SAB Ect Mult Living                 Review of Systems  Ten systems reviewed and are negative for acute change, except as noted in the HPI.    Allergies  Penicillins  Home Medications   Prior to Admission medications   Medication Sig Start Date End Date Taking? Authorizing Provider  escitalopram (LEXAPRO) 10 MG tablet Take 10 mg by mouth daily.   Yes Historical Provider, MD  fluticasone (FLONASE) 50 MCG/ACT nasal spray Place 1 spray into both nostrils daily.   Yes Historical Provider, MD   pantoprazole (PROTONIX) 20 MG tablet Take 20 mg by mouth daily.   Yes Historical Provider, MD   BP 133/86  Pulse 82  Temp(Src) 97.7 F (36.5 C) (Oral)  Resp 15  SpO2 99%  LMP 07/11/2014 Physical Exam  Constitutional: She is oriented to person, place, and time. She appears well-developed and well-nourished. No distress.  HENT:  Head: Normocephalic and atraumatic.  Eyes: Conjunctivae are normal. No scleral icterus.  Neck: Normal range of motion.  Cardiovascular: Normal rate, regular rhythm and normal heart sounds.  Exam reveals no gallop and no friction rub.   No murmur heard. Pulmonary/Chest: Effort normal and breath sounds normal. No respiratory distress.  Abdominal: Soft. Bowel sounds are normal. She exhibits no distension and no mass. There is tenderness (LLQ). There is no guarding.  Negative for rebound tenderness Negative murphy's  Or CVA tenderness  Neurological: She is alert and oriented to person, place, and time.  Skin: Skin is warm and dry. She is not diaphoretic.    ED Course  Procedures (including critical care time) Labs Review Labs Reviewed  COMPREHENSIVE METABOLIC PANEL - Abnormal; Notable for the following:    Potassium 3.6 (*)    Glucose, Bld 100 (*)    Alkaline Phosphatase 33 (*)    All other components within normal limits  URINALYSIS, ROUTINE W REFLEX MICROSCOPIC - Abnormal; Notable for the following:    Ketones, ur 15 (*)  All other components within normal limits  CBC WITH DIFFERENTIAL  LIPASE, BLOOD  POC URINE PREG, ED    Imaging Review Dg Abd Acute W/chest  07/15/2014   CLINICAL DATA:  Nausea, bloating, diarrhea, mid sternum pain radiating to upper back x 4 days.  EXAM: ACUTE ABDOMEN SERIES (ABDOMEN 2 VIEW & CHEST 1 VIEW)  COMPARISON:  None.  FINDINGS: Azygos fissure with airspace consolidation at its medial margin. Left lung clear. Heart size normal. No effusion.  No free air. Normal bowel gas pattern. No abnormal abdominal calcifications.  Regional bones unremarkable.  IMPRESSION: 1. Normal bowel gas pattern.  No free air. 2. Focal airspace consolidation in the right upper lobe adjacent to the azygos fissure.   Electronically Signed   By: Arne Cleveland M.D.   On: 07/15/2014 14:47     EKG Interpretation None      MDM   Final diagnoses:  None    4:00 PM BP 133/86  Pulse 82  Temp(Src) 97.7 F (36.5 C) (Oral)  Resp 15  SpO2 99%  LMP 07/11/2014 Patient plainfilms show a focal consolidation in the RUL, however the patient is afebrile.  LLQ abdominal pain on exam concerning for possible acute appendicitis, however her hx and sx expression are more concerning for cholelithiasis. Her Alk phos is elevated. I feel that she will need an abdominal US if CT is negative. Patient declines any pain meds.  I have given report to Dr. Robina Ade who will assume care of the patient .    Margarita Mail, PA-C 07/15/14 [phone]

## 2014-07-15 NOTE — ED Notes (Signed)
Transported to CT 

## 2014-07-15 NOTE — Discharge Instructions (Signed)
Abdominal Pain, Women °Abdominal (stomach, pelvic, or belly) pain can be caused by many things. It is important to tell your doctor: °· The location of the pain. °· Does it come and go or is it present all the time? °· Are there things that start the pain (eating certain foods, exercise)? °· Are there other symptoms associated with the pain (fever, nausea, vomiting, diarrhea)? °All of this is helpful to know when trying to find the cause of the pain. °CAUSES  °· Stomach: virus or bacteria infection, or ulcer. °· Intestine: appendicitis (inflamed appendix), regional ileitis (Crohn's disease), ulcerative colitis (inflamed colon), irritable bowel syndrome, diverticulitis (inflamed diverticulum of the colon), or cancer of the stomach or intestine. °· Gallbladder disease or stones in the gallbladder. °· Kidney disease, kidney stones, or infection. °· Pancreas infection or cancer. °· Fibromyalgia (pain disorder). °· Diseases of the female organs: °¨ Uterus: fibroid (non-cancerous) tumors or infection. °¨ Fallopian tubes: infection or tubal pregnancy. °¨ Ovary: cysts or tumors. °¨ Pelvic adhesions (scar tissue). °¨ Endometriosis (uterus lining tissue growing in the pelvis and on the pelvic organs). °¨ Pelvic congestion syndrome (female organs filling up with blood just before the menstrual period). °¨ Pain with the menstrual period. °¨ Pain with ovulation (producing an egg). °¨ Pain with an IUD (intrauterine device, birth control) in the uterus. °¨ Cancer of the female organs. °· Functional pain (pain not caused by a disease, may improve without treatment). °· Psychological pain. °· Depression. °DIAGNOSIS  °Your doctor will decide the seriousness of your pain by doing an examination. °· Blood tests. °· X-rays. °· Ultrasound. °· CT scan (computed tomography, special type of X-ray). °· MRI (magnetic resonance imaging). °· Cultures, for infection. °· Barium enema (dye inserted in the large intestine, to better view it with  X-rays). °· Colonoscopy (looking in intestine with a lighted tube). °· Laparoscopy (minor surgery, looking in abdomen with a lighted tube). °· Major abdominal exploratory surgery (looking in abdomen with a large incision). °TREATMENT  °The treatment will depend on the cause of the pain.  °· Many cases can be observed and treated at home. °· Over-the-counter medicines recommended by your caregiver. °· Prescription medicine. °· Antibiotics, for infection. °· Birth control pills, for painful periods or for ovulation pain. °· Hormone treatment, for endometriosis. °· Nerve blocking injections. °· Physical therapy. °· Antidepressants. °· Counseling with a psychologist or psychiatrist. °· Minor or major surgery. °HOME CARE INSTRUCTIONS  °· Do not take laxatives, unless directed by your caregiver. °· Take over-the-counter pain medicine only if ordered by your caregiver. Do not take aspirin because it can cause an upset stomach or bleeding. °· Try a clear liquid diet (broth or water) as ordered by your caregiver. Slowly move to a bland diet, as tolerated, if the pain is related to the stomach or intestine. °· Have a thermometer and take your temperature several times a day, and record it. °· Bed rest and sleep, if it helps the pain. °· Avoid sexual intercourse, if it causes pain. °· Avoid stressful situations. °· Keep your follow-up appointments and tests, as your caregiver orders. °· If the pain does not go away with medicine or surgery, you may try: °¨ Acupuncture. °¨ Relaxation exercises (yoga, meditation). °¨ Group therapy. °¨ Counseling. °SEEK MEDICAL CARE IF:  °· You notice certain foods cause stomach pain. °· Your home care treatment is not helping your pain. °· You need stronger pain medicine. °· You want your IUD removed. °· You feel faint or   lightheaded. °· You develop nausea and vomiting. °· You develop a rash. °· You are having side effects or an allergy to your medicine. °SEEK IMMEDIATE MEDICAL CARE IF:  °· Your  pain does not go away or gets worse. °· You have a fever. °· Your pain is felt only in portions of the abdomen. The right side could possibly be appendicitis. The left lower portion of the abdomen could be colitis or diverticulitis. °· You are passing blood in your stools (bright red or black tarry stools, with or without vomiting). °· You have blood in your urine. °· You develop chills, with or without a fever. °· You pass out. °MAKE SURE YOU:  °· Understand these instructions. °· Will watch your condition. °· Will get help right away if you are not doing well or get worse. °Document Released: 07/26/2007 Document Revised: 02/12/2014 Document Reviewed: 08/15/2009 °[employer]® Patient Information ©2015 [employer]. This information is not intended to replace advice given to you by your health care provider. Make sure you discuss any questions you have with your health care provider. ° °

## 2014-07-16 NOTE — ED Provider Notes (Signed)
I saw and evaluated the patient, reviewed the resident's note and I agree with the findings and plan.  I personally interpreted the EKG as well as the resident and agree with the interpretation on the resident's chart.  Final diagnoses:  Epigastric abdominal pain  Nausea      Vida RollerBrian D Calahan Pak, MD 07/16/14 [phone]

## 2014-07-18 NOTE — ED Provider Notes (Signed)
Medical screening examination/treatment/procedure(s) were performed by non-physician practitioner and as supervising physician I was immediately available for consultation/collaboration.   EKG Interpretation None        Warnell Foresterrey Jerral Mccauley, MD 07/18/14 [phone]

## 2014-08-14 ENCOUNTER — Other Ambulatory Visit: Payer: Self-pay | Admitting: Internal Medicine

## 2014-08-14 DIAGNOSIS — R1011 Right upper quadrant pain: Secondary | ICD-10-CM

## 2014-08-15 ENCOUNTER — Ambulatory Visit
Admission: RE | Admit: 2014-08-15 | Discharge: 2014-08-15 | Disposition: A | Discharge status: Home / Self Care | Payer: Federal, State, Local not specified - PPO | Source: Ambulatory Visit | Attending: Internal Medicine | Admitting: Internal Medicine

## 2014-08-15 DIAGNOSIS — R1011 Right upper quadrant pain: Secondary | ICD-10-CM

## 2014-11-30 ENCOUNTER — Other Ambulatory Visit: Payer: Self-pay | Admitting: Otolaryngology

## 2014-11-30 DIAGNOSIS — E042 Nontoxic multinodular goiter: Secondary | ICD-10-CM

## 2014-12-13 ENCOUNTER — Ambulatory Visit
Admission: RE | Admit: 2014-12-13 | Discharge: 2014-12-13 | Disposition: A | Discharge status: Home / Self Care | Payer: Federal, State, Local not specified - PPO | Source: Ambulatory Visit | Attending: Otolaryngology | Admitting: Otolaryngology

## 2014-12-13 DIAGNOSIS — E042 Nontoxic multinodular goiter: Secondary | ICD-10-CM

## 2015-01-06 IMAGING — CT CT ABD-PELV W/ CM
2 of 5 series · 16 of 46 positions shown, 18 images · IV contrast (Omni 300)
Comparison: Acute abdominal series 07/15/2014.

CLINICAL DATA: Initial presentation for right upper quadrant pain
extending to the back. Epigastric pain. Six months postpartum. The
physician order states left lower quadrant pain.

EXAM:
CT ABDOMEN AND PELVIS WITH CONTRAST
TECHNIQUE: Multidetector CT imaging of the abdomen and pelvis was performed
using the standard protocol following bolus administration of
intravenous contrast.
CONTRAST:  100mL OMNIPAQUE IOHEXOL 300 MG/ML  SOLN

[Series 2: abd/ pelvis 5.0 i30f 1 · axial · 0.70mm/px · z∈[-516,-46]mm · 13 of 106 slices shown, 15 images]
[im 6/106  soft-tissue]
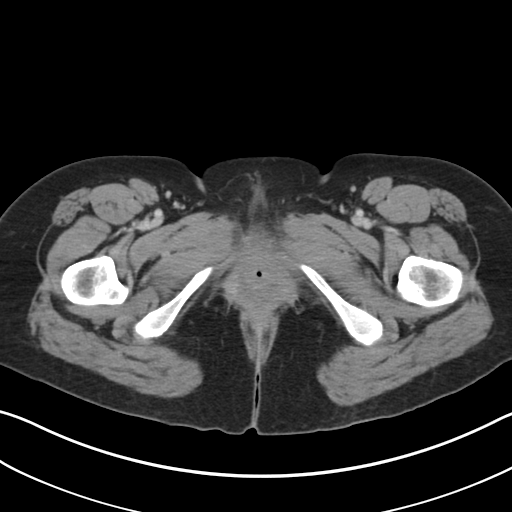
[im 6/106  bone]
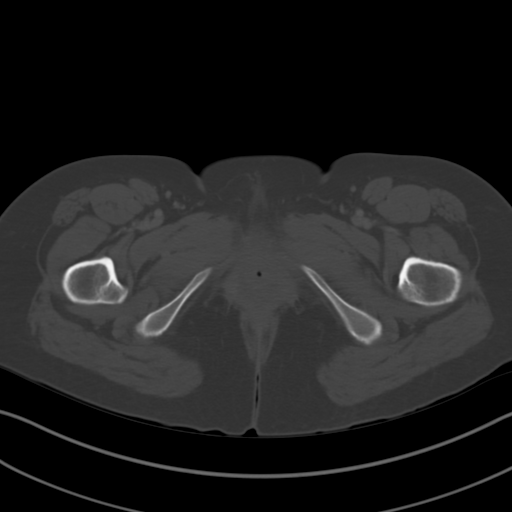
[im 16/106  soft-tissue]
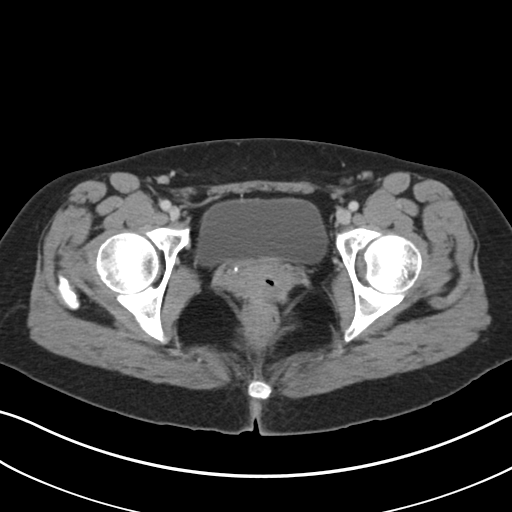
[im 22/106  soft-tissue]
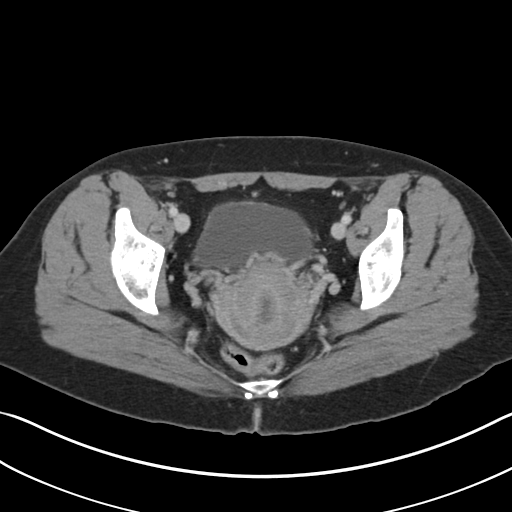
[im 32/106  soft-tissue]
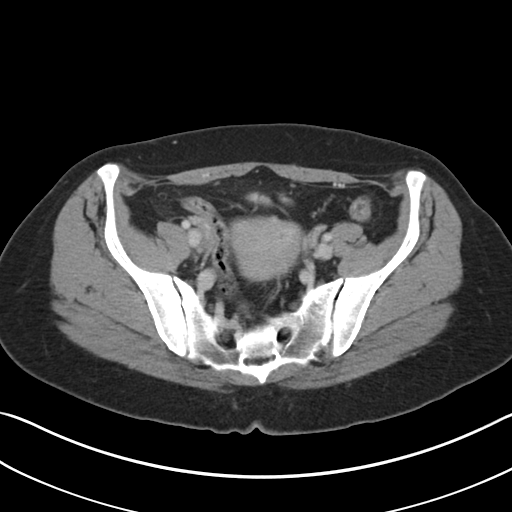
[im 37/106  soft-tissue]
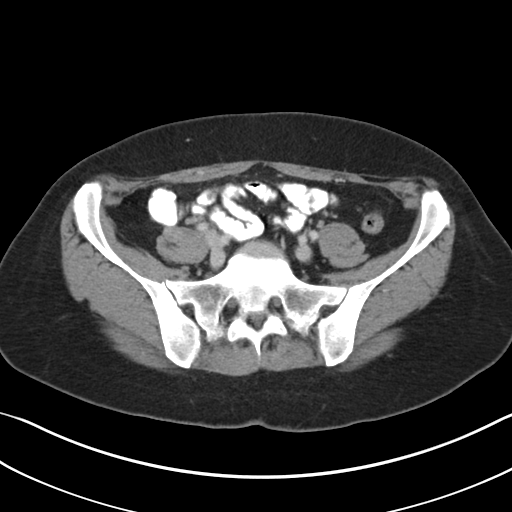
[im 48/106  soft-tissue]
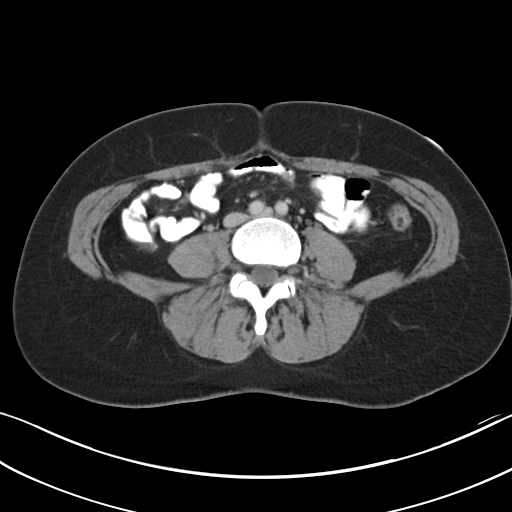
[im 53/106  soft-tissue]
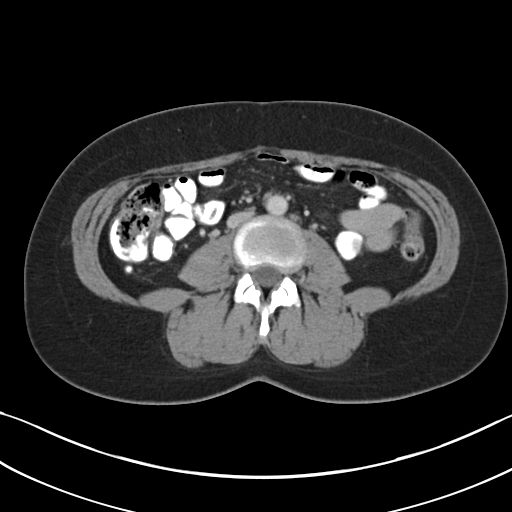
[im 58/106  soft-tissue]
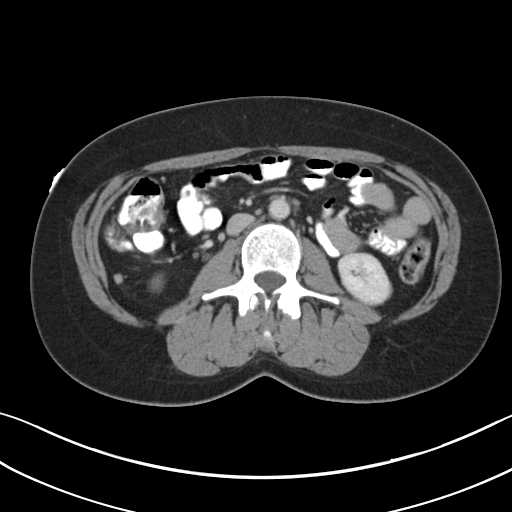
[im 69/106  soft-tissue]
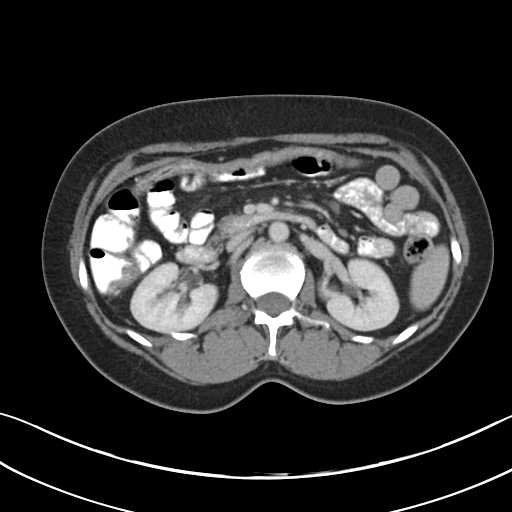
[im 69/106  bone]
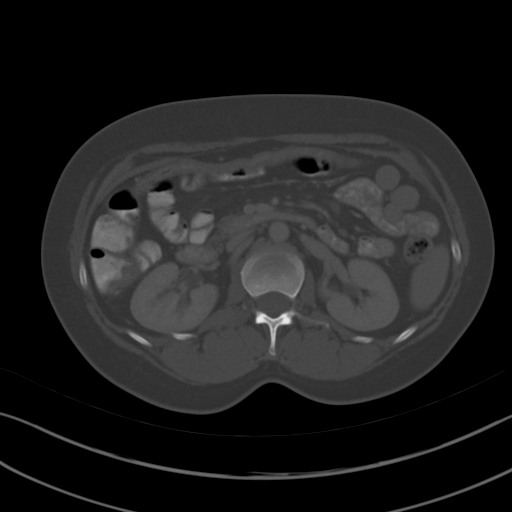
[im 74/106  soft-tissue]
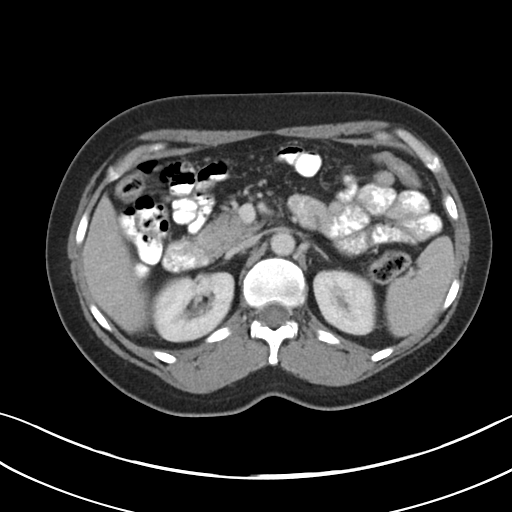
[im 85/106  soft-tissue]
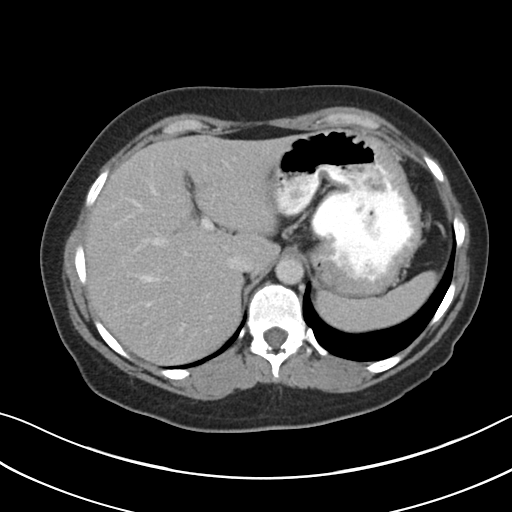
[im 90/106  soft-tissue]
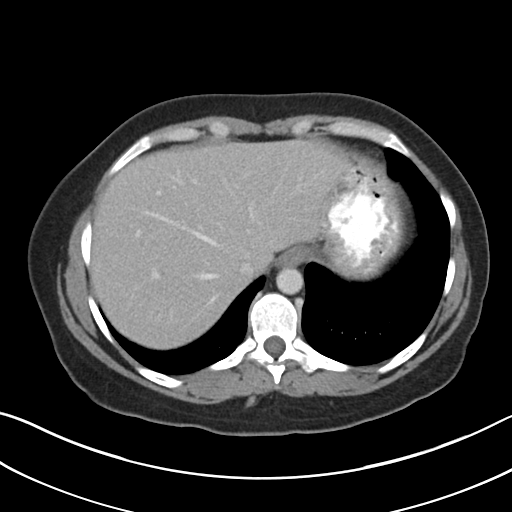
[im 100/106  soft-tissue]
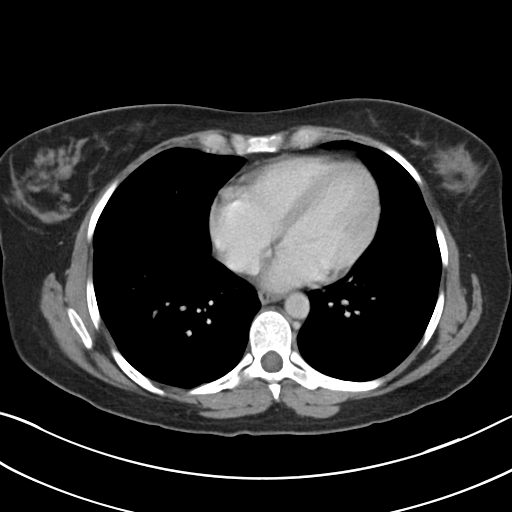

[Series 5: coronals · coronal · 0.75mm/px · 3 of 124 slices shown]
[im 42/124  soft-tissue]
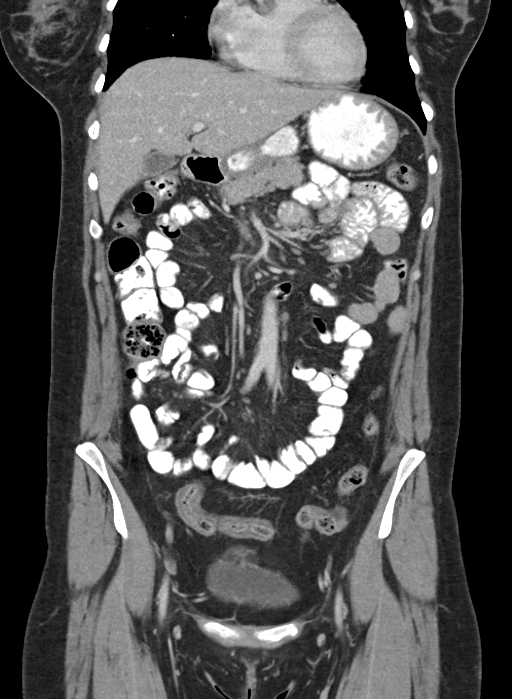
[im 55/124  soft-tissue]
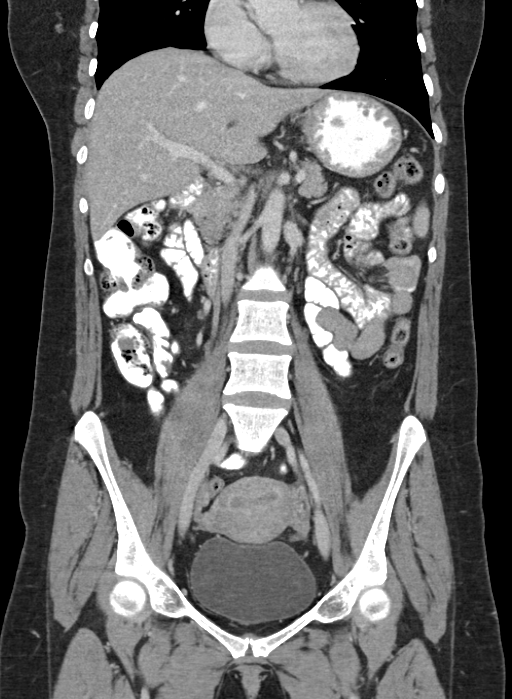
[im 69/124  soft-tissue]
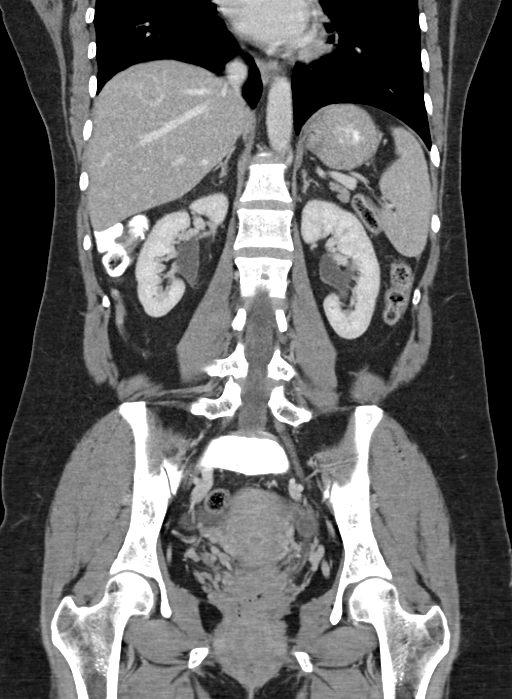

[16 of 46 positions shown; findings below may reference images not displayed]

FINDINGS: The lung bases are clear without focal nodule, mass, or airspace
disease. The heart size is normal. No significant pleural or
pericardial effusion is present.

The liver and spleen are within normal limits. Stomach, duodenum,
and pancreas are unremarkable. The common bile duct and gallbladder
are within normal limits. The adrenal glands are normal bilaterally.
The kidneys are within normal limits.

The rectosigmoid colon is within normal limits. The remainder of the
colon is unremarkable. The appendix is visualized and normal. The
uterus is mildly edematous. Likely corresponding to the patient's
cycle. There is some fluid surrounding the right adnexa and
extending into the anatomic pelvis, likely physiologic. No other
significant free fluid or adenopathy is present.

Bone windows demonstrate no focal lytic or blastic lesions.
IMPRESSION: 1. No acute or focal lesion to explain the patient's symptoms.
2. Fluid surrounding the right ovary and into the anatomic pelvis is
likely physiologic. This may be related to recent follicle rupture.

## 2015-02-06 IMAGING — US US ABDOMEN LIMITED
1 series · 14 of 25 positions shown · non-contrast
Comparison: CT, 07/15/2014.

CLINICAL DATA: Right upper quadrant pain.

EXAM:
US ABDOMEN LIMITED - RIGHT UPPER QUADRANT

[Series 1: us abdomen limited · 0.24mm/px · 14 of 46 slices shown]
[im 1/46]
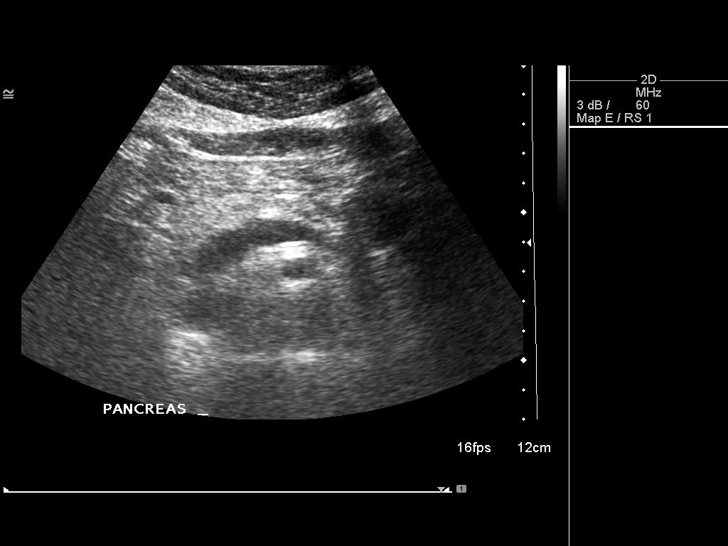
[im 4/46]
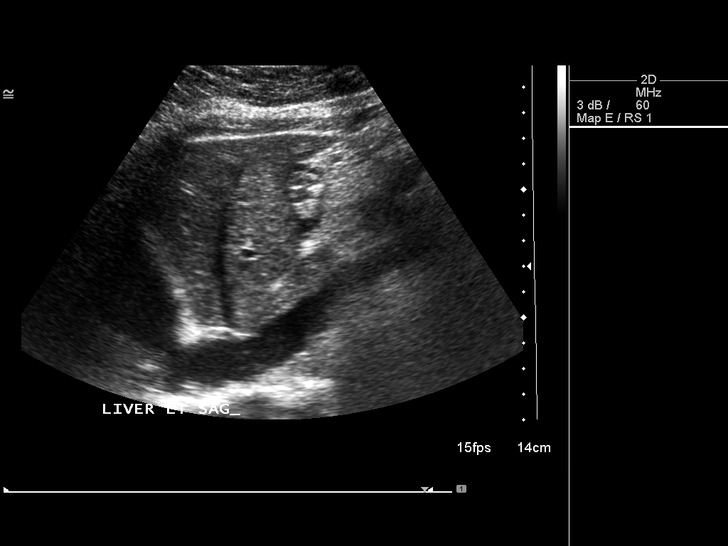
[im 8/46]
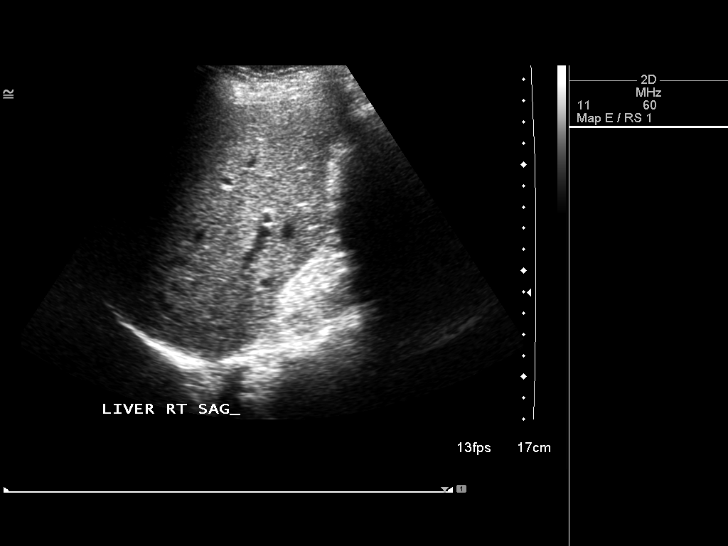
[im 12/46]
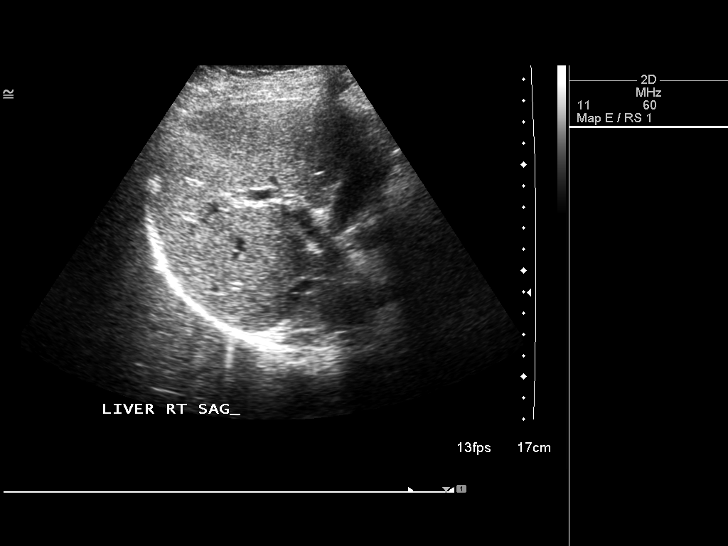
[im 16/46]
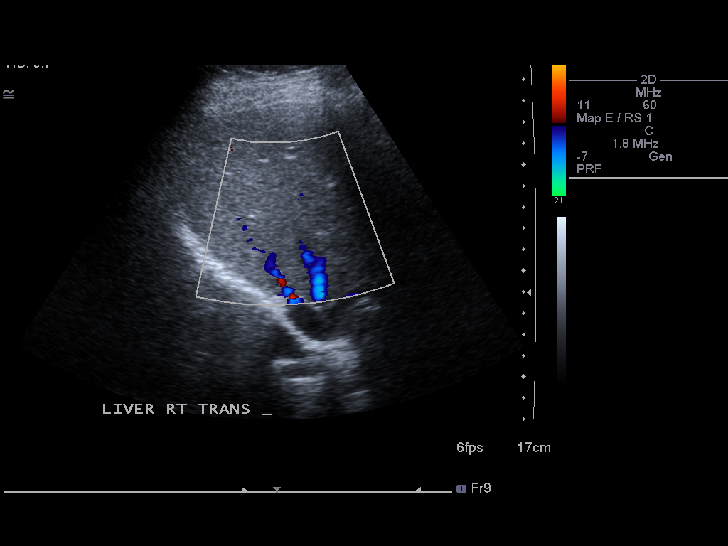
[im 17/46]
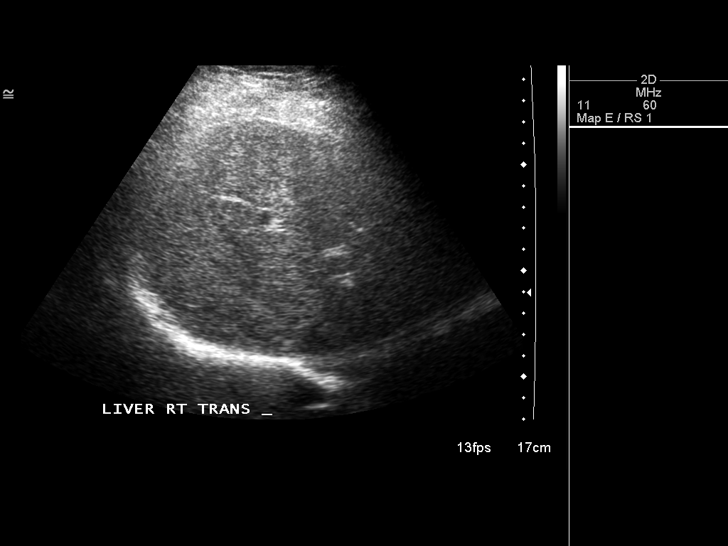
[im 21/46]
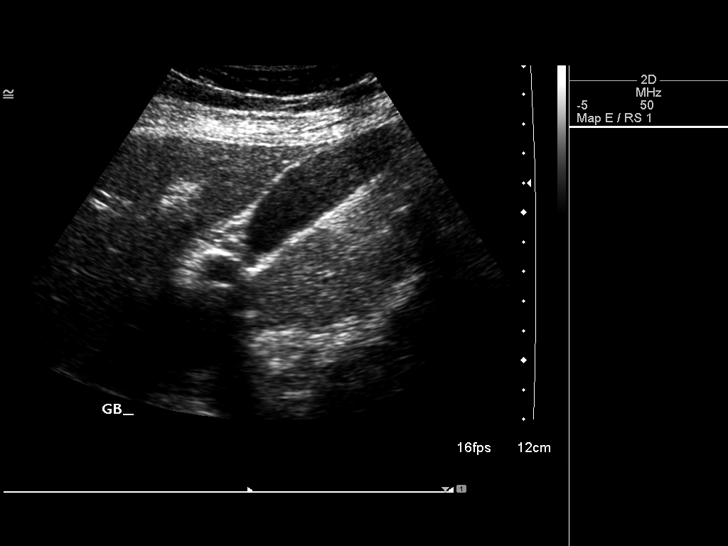
[im 25/46]
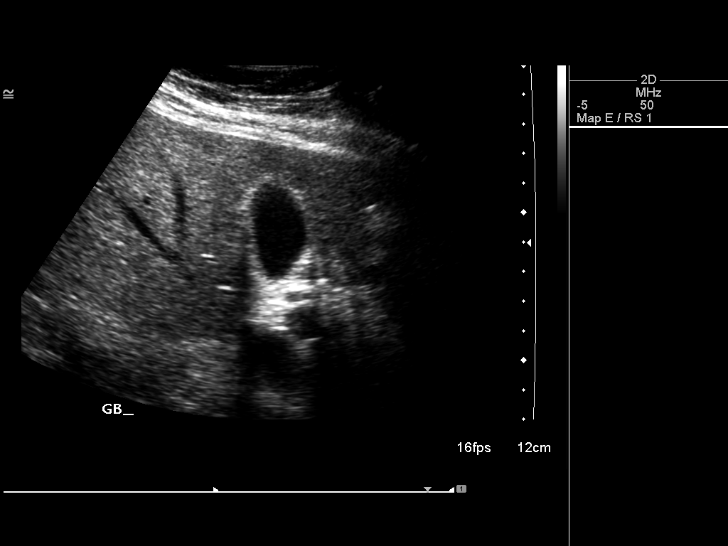
[im 29/46]
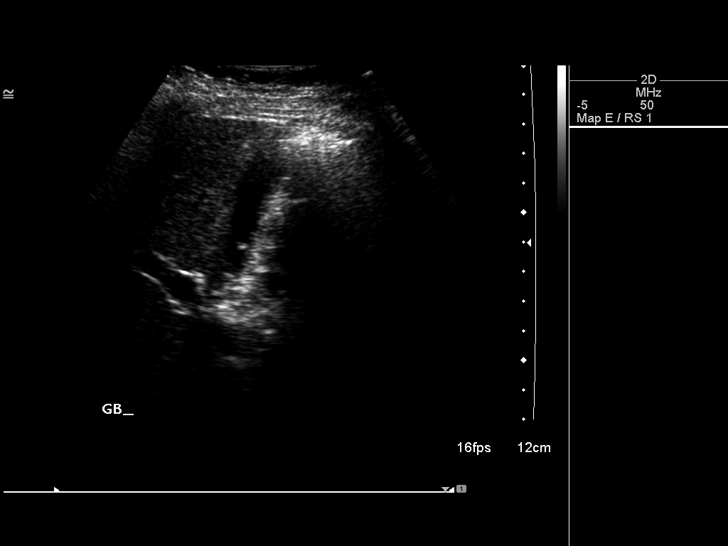
[im 31/46]
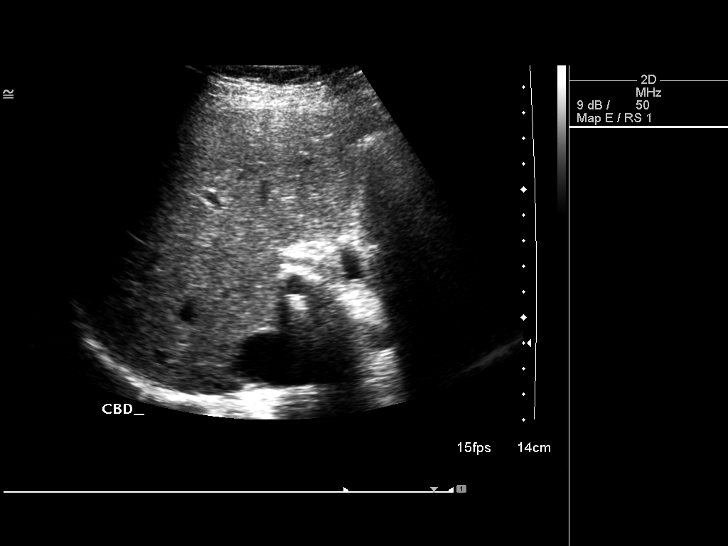
[im 34/46]
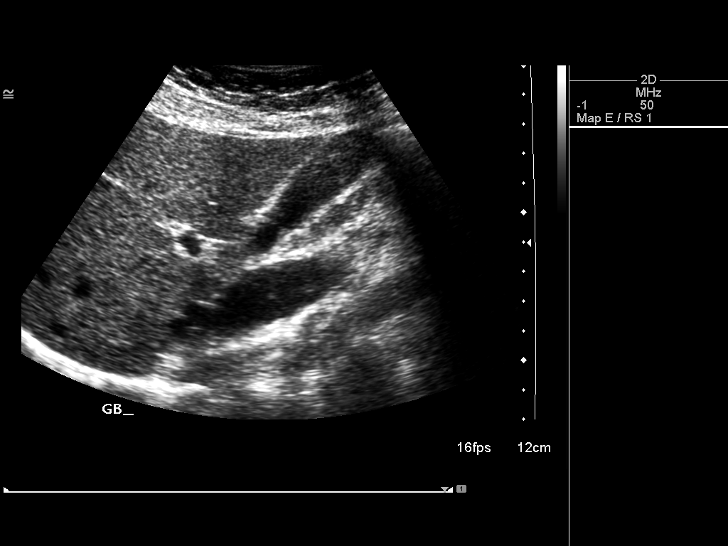
[im 38/46]
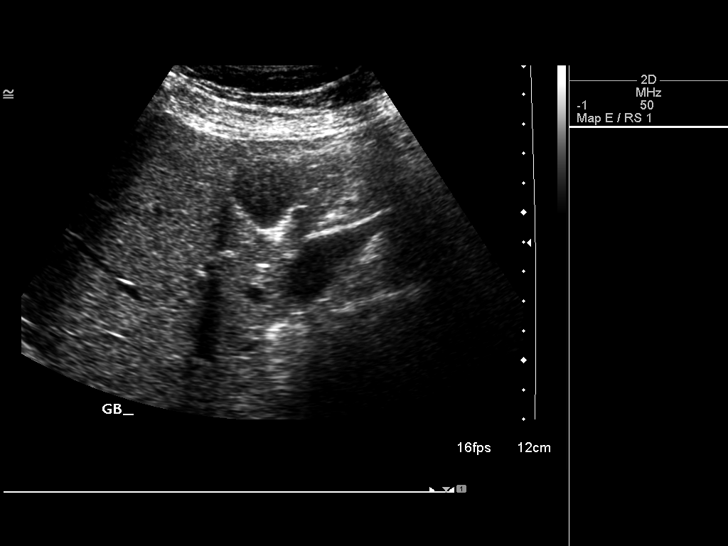
[im 42/46]
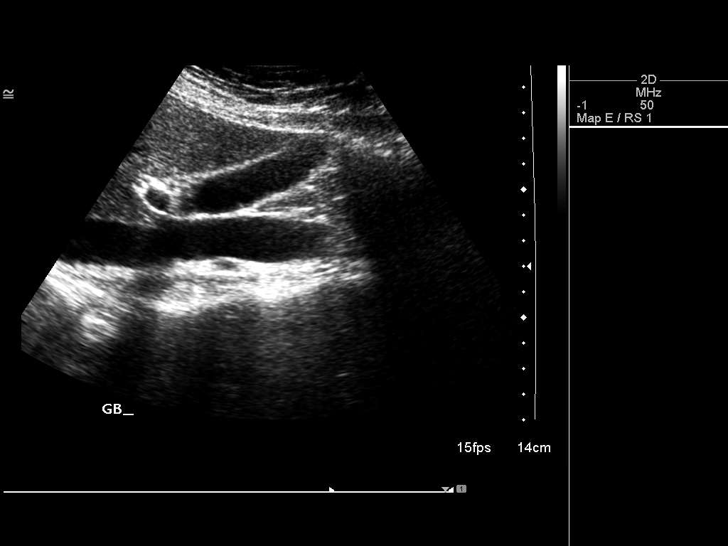
[im 46/46]
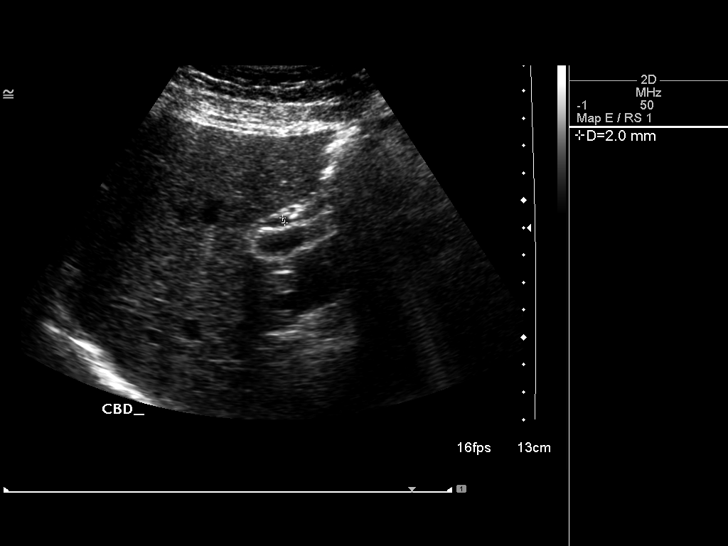

[14 of 25 positions shown; findings below may reference images not displayed]

FINDINGS: Gallbladder:

Mildly distended. Small amount of echogenic material is mobile
within the dependent gallbladder likely sludge. No discrete
shadowing stone. No wall thickening. No evidence of acute
cholecystitis.

Common bile duct:

Diameter: 3.1 mm

Liver:

No focal lesion identified. Within normal limits in parenchymal
echogenicity.
IMPRESSION: 1. Small amount of dependent gallbladder sludge. No stones. No
evidence of acute cholecystitis.
2. No bile duct dilation.  Normal liver.

## 2015-09-20 ENCOUNTER — Encounter: Payer: Federal, State, Local not specified - PPO | Admitting: Sports Medicine

## 2015-10-01 ENCOUNTER — Encounter: Payer: Federal, State, Local not specified - PPO | Admitting: Sports Medicine

## 2015-11-27 ENCOUNTER — Other Ambulatory Visit: Payer: Self-pay | Admitting: Otolaryngology

## 2015-11-27 DIAGNOSIS — E041 Nontoxic single thyroid nodule: Secondary | ICD-10-CM

## 2015-12-03 ENCOUNTER — Other Ambulatory Visit: Payer: Federal, State, Local not specified - PPO

## 2015-12-10 ENCOUNTER — Encounter: Payer: Federal, State, Local not specified - PPO | Admitting: Sports Medicine

## 2015-12-23 ENCOUNTER — Ambulatory Visit (INDEPENDENT_AMBULATORY_CARE_PROVIDER_SITE_OTHER): Payer: Federal, State, Local not specified - PPO | Admitting: Sports Medicine

## 2015-12-23 ENCOUNTER — Encounter: Payer: Self-pay | Admitting: Sports Medicine

## 2015-12-23 VITALS — BP 132/88

## 2015-12-23 DIAGNOSIS — Z4689 Encounter for fitting and adjustment of other specified devices: Secondary | ICD-10-CM

## 2015-12-23 DIAGNOSIS — M25532 Pain in left wrist: Secondary | ICD-10-CM

## 2015-12-23 DIAGNOSIS — M2170 Unequal limb length (acquired), unspecified site: Secondary | ICD-10-CM | POA: Diagnosis not present

## 2015-12-23 LAB — CBC WITH DIFFERENTIAL/PLATELET
Basophils Absolute: 0.1 10*3/uL (ref 0.0–0.1)
Basophils Relative: 1 % (ref 0–1)
Eosinophils Absolute: 0.2 10*3/uL (ref 0.0–0.7)
Eosinophils Relative: 2 % (ref 0–5)
HEMATOCRIT: 31.8 % — AB (ref 36.0–46.0)
Hemoglobin: 9.8 g/dL — ABNORMAL LOW (ref 12.0–15.0)
LYMPHS ABS: 1.6 10*3/uL (ref 0.7–4.0)
LYMPHS PCT: 20 % (ref 12–46)
MCH: 21.7 pg — AB (ref 26.0–34.0)
MCHC: 30.8 g/dL (ref 30.0–36.0)
MCV: 70.5 fL — AB (ref 78.0–100.0)
MONO ABS: 0.6 10*3/uL (ref 0.1–1.0)
MPV: 9.8 fL (ref 8.6–12.4)
Monocytes Relative: 7 % (ref 3–12)
Neutro Abs: 5.5 10*3/uL (ref 1.7–7.7)
Neutrophils Relative %: 70 % (ref 43–77)
PLATELETS: 470 10*3/uL — AB (ref 150–400)
RBC: 4.51 MIL/uL (ref 3.87–5.11)
RDW: 17.3 % — ABNORMAL HIGH (ref 11.5–15.5)
WBC: 7.9 10*3/uL (ref 4.0–10.5)

## 2015-12-23 LAB — RHEUMATOID FACTOR

## 2015-12-23 LAB — COMPREHENSIVE METABOLIC PANEL
ALK PHOS: 35 U/L (ref 33–115)
ALT: 13 U/L (ref 6–29)
AST: 19 U/L (ref 10–30)
Albumin: 4.2 g/dL (ref 3.6–5.1)
BUN: 15 mg/dL (ref 7–25)
CALCIUM: 8.9 mg/dL (ref 8.6–10.2)
CHLORIDE: 101 mmol/L (ref 98–110)
CO2: 25 mmol/L (ref 20–31)
Creat: 0.6 mg/dL (ref 0.50–1.10)
GLUCOSE: 103 mg/dL — AB (ref 65–99)
POTASSIUM: 4.5 mmol/L (ref 3.5–5.3)
Sodium: 136 mmol/L (ref 135–146)
Total Bilirubin: 0.4 mg/dL (ref 0.2–1.2)
Total Protein: 7.3 g/dL (ref 6.1–8.1)

## 2015-12-23 LAB — SEDIMENTATION RATE: Sed Rate: 32 mm/hr — ABNORMAL HIGH (ref 0–20)

## 2015-12-23 LAB — C-REACTIVE PROTEIN: CRP: <0.5 mg/dL (ref <0.60)

## 2015-12-23 NOTE — Progress Notes (Signed)
Subjective:    Patient ID: Melinda [name], female    DOB: 07/04/1971, 45 y.o.   MRN: [id number]  HPI Patient [facility] for replacement of her orthotic sole inserts.  She notes that she has had orthotics since her 20's when she was an avid runner. She reports that she was noted to have a leg length discrepancy around that time.  She reports her last pair of orthotics was made about 2.5-3 years ago.  She wears them daily.  She notes right hip and right shoulder pain if she does not wear them.  Denies foot, knee or ankle pain.  No history of fracture/ ligamentous injury in the past.  Patient wears a size 10 women's.  Additionally, patient notes left > right wrist pain that has been ongoing for about 3 months.  She believes it to be post viral.  She notes that pain is worse with extension of her wrist.  Pain is located on the dorsal aspect of her wrist and does not radiate.  No other associated symptoms including swelling or rash.  Review of Systems She notes right hip and right shoulder pain if she does not wear them.  Denies foot, knee or ankle pain.  No history of fracture/ ligamentous injury in the past.    Objective:   Physical Exam  Gen: awake, alert, well appearing female MSK: normal gait and station, RLE slightly longer than LLE (<1cm difference).  Right hip slightly anterior.  No inflare or outflare of hip appreciated.  Foot: both L and R feet neutral.   L Wrist: FROM, no TTP but pain with extension and axial load applied to wrist, no swelling, no erythema, no palpable abnormalities.       Assessment & Plan:   1. Fitting and adjustment of orthopedic device.  Patient fitted and provided custom orthotics today. Size 9 used. - Discussed difference between previous rigid orthotics and new semi rigid orthotics - Encouraged patient to return if she needs adjustments to these  2. Leg length discrepancy.  VERY small discrepancy.  Patient has used elevation in Left  shoe for this previously, so will send home with heel lift to use PRN. - 3/16 heel lift provided to use IF needed in Left shoe.  Instructions on use discussed  3. Left wrist pain.  No known exposure to ticks 3 months ago.  No associated rash.  Husband has similar symptoms.  No history of STI.  No systemic symptoms currently.  DDx: post viral/ reactive arthritis (though no associated joint swelling) vs new rheumatologic arthritis.  Will obtain labs and contact patient with results. - CBC w/Diff; Future - Comp Met (CMET); Future - Sed Rate (ESR); Future - C-reactive protein; Future - Antinuclear Antib (ANA); Future - Rheumatoid Factor; Future - Lyme Aby, Western Blot IgG & IgM w/bands; Future - Consider referral to Rheumatology   Patient seen and evaluated with the resident. I agree with the above plan of care. Phone follow-up with the laboratory results once available. Consider rheumatology referral depending on those results. A total of 30 minutes was spent with the patient today with greater than 50% of the time spent in face-to-face consultation discussing orthotic construction, instruction, and fitting. Time was also spent discussing her left wrist pain and workup for this.  Patient was fitted for a : standard, cushioned, semi-rigid orthotic. The orthotic was heated and afterward the patient stood on the orthotic blank positioned on the orthotic stand. The patient was positioned in  subtalar neutral position and 10 degrees of ankle dorsiflexion in a weight bearing stance. After completion of molding, a stable base was applied to the orthotic blank. The blank was ground to a stable position for weight bearing. Size: 9 Base: Blue EVA Posting: none Additional orthotic padding: none

## 2015-12-24 LAB — ANA: ANA: NEGATIVE

## 2015-12-25 LAB — LYME ABY, WSTRN BLT IGG & IGM W/BANDS
B burgdorferi IgG Abs (IB): NEGATIVE
B burgdorferi IgM Abs (IB): NEGATIVE
LYME DISEASE 18 KD IGG: NONREACTIVE
LYME DISEASE 23 KD IGG: NONREACTIVE
LYME DISEASE 28 KD IGG: NONREACTIVE
LYME DISEASE 30 KD IGG: NONREACTIVE
LYME DISEASE 41 KD IGM: NONREACTIVE
Lyme Disease 23 kD IgM: NONREACTIVE
Lyme Disease 39 kD IgG: NONREACTIVE
Lyme Disease 39 kD IgM: NONREACTIVE
Lyme Disease 41 kD IgG: NONREACTIVE
Lyme Disease 45 kD IgG: NONREACTIVE
Lyme Disease 58 kD IgG: NONREACTIVE
Lyme Disease 66 kD IgG: NONREACTIVE
Lyme Disease 93 kD IgG: NONREACTIVE

## 2015-12-26 ENCOUNTER — Telehealth: Payer: Self-pay | Admitting: Sports Medicine

## 2015-12-26 NOTE — Telephone Encounter (Signed)
I spoke with the patient on the phone today after reviewing her laboratory data. Her sedimentation rate is slightly elevated at 32. I believe that this is likely a post viral inflammatory reaction and I've recommended that she simply give it more time. If her wrist pain persists or worsens then she may benefit from a referral to rheumatology. I do not believe that there is any structural abnormality that would warrant imaging at this point in time. It also appears that she may be anemic. Her hemoglobin is 9.8. She has a history of postpartum anemia but otherwise has been healthy. I've asked her to follow-up with her primary care physician or gynecologist to work this up further. She understands. Follow-up with me as needed.

## 2016-02-24 ENCOUNTER — Ambulatory Visit
Admission: RE | Admit: 2016-02-24 | Discharge: 2016-02-24 | Disposition: A | Discharge status: Home / Self Care | Payer: Federal, State, Local not specified - PPO | Source: Ambulatory Visit | Attending: Otolaryngology | Admitting: Otolaryngology

## 2016-02-24 DIAGNOSIS — E041 Nontoxic single thyroid nodule: Secondary | ICD-10-CM

## 2018-02-01 ENCOUNTER — Other Ambulatory Visit: Payer: Self-pay | Admitting: Obstetrics and Gynecology

## 2018-02-01 DIAGNOSIS — R928 Other abnormal and inconclusive findings on diagnostic imaging of breast: Secondary | ICD-10-CM

## 2018-02-04 ENCOUNTER — Ambulatory Visit
Admission: RE | Admit: 2018-02-04 | Discharge: 2018-02-04 | Disposition: A | Discharge status: Home / Self Care | Payer: Federal, State, Local not specified - PPO | Source: Ambulatory Visit | Attending: Obstetrics and Gynecology | Admitting: Obstetrics and Gynecology

## 2018-02-04 DIAGNOSIS — R928 Other abnormal and inconclusive findings on diagnostic imaging of breast: Secondary | ICD-10-CM

## 2018-07-29 IMAGING — MG DIGITAL DIAGNOSTIC BILATERAL MAMMOGRAM WITH TOMO AND CAD
7 series · 8 of 15 positions shown · non-contrast
Comparison: 01/31/2018 and earlier

CLINICAL DATA: Patient returns after screening study for evaluation
of possible RIGHT breast mass/distortion and calcifications in the
LEFT breast.

EXAM:
DIGITAL DIAGNOSTIC BILATERAL MAMMOGRAM WITH CAD AND TOMO
ULTRASOUND RIGHT BREAST

[L CC]
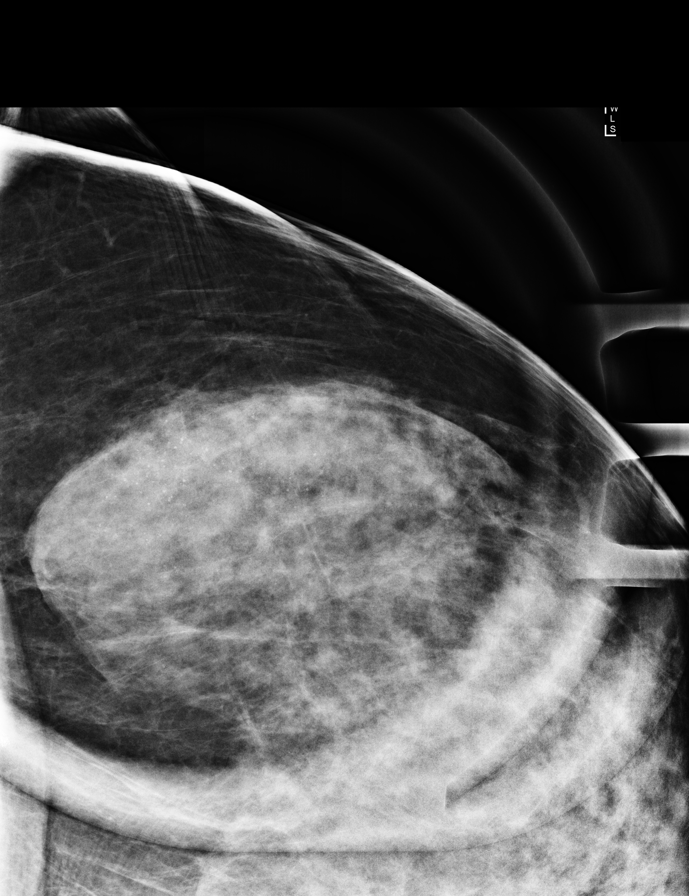

[L ML (1 of 2)]
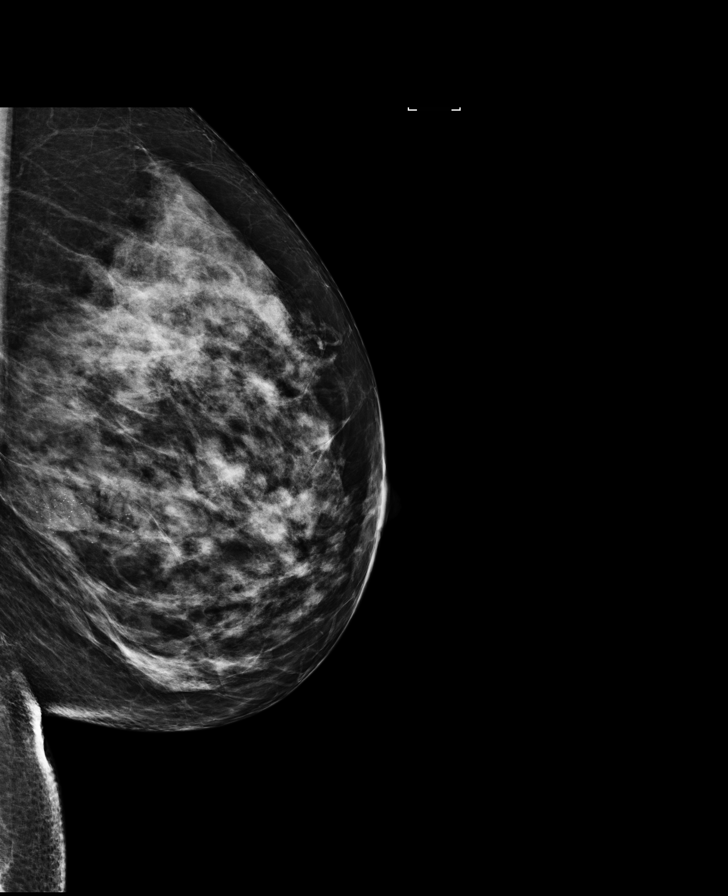

[L ML (2 of 2)]
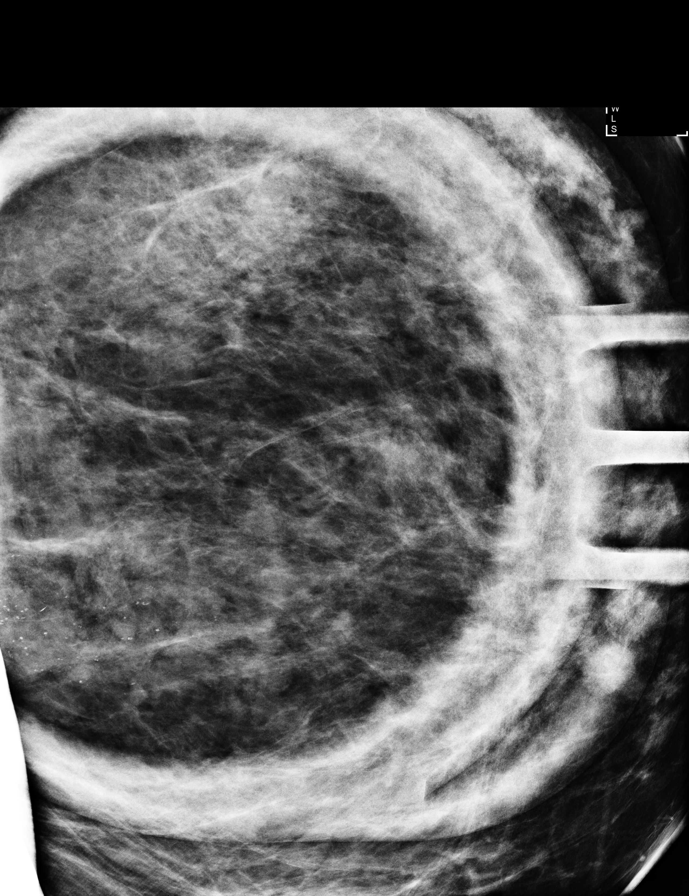

[R MLO synth-2D]
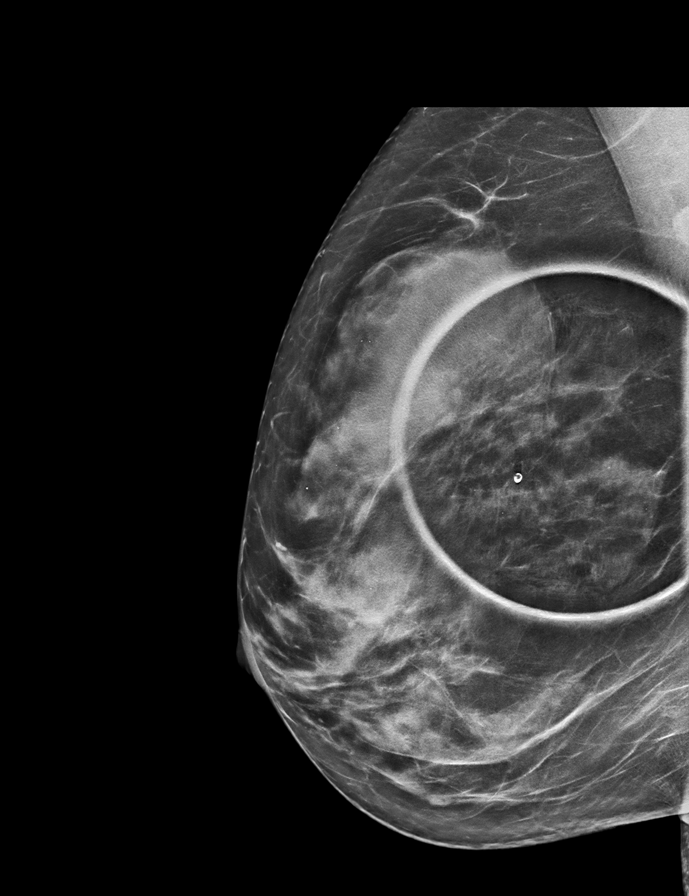

[R CC synth-2D]
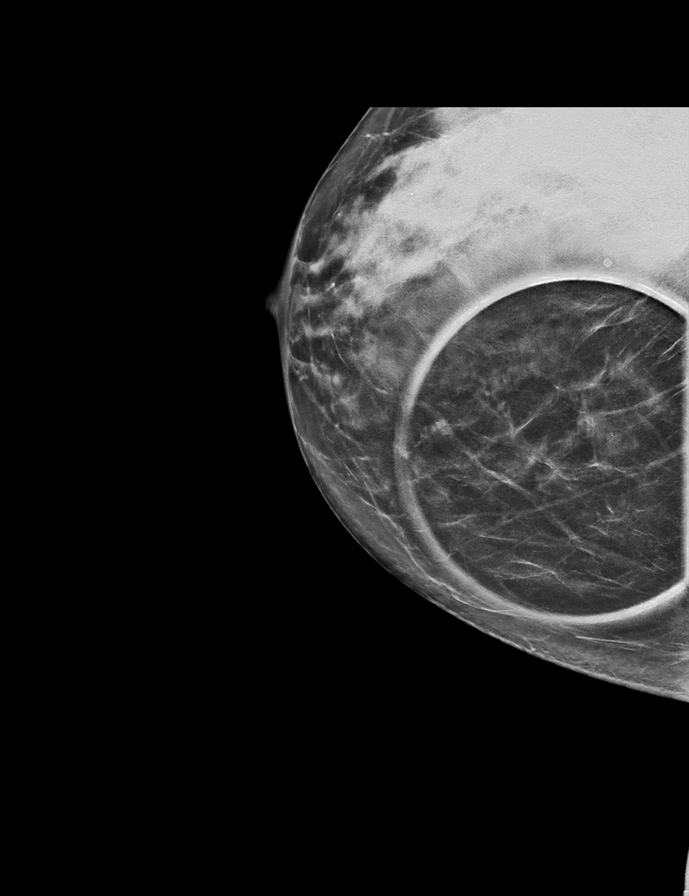

[R CC tomo · 2 of 68 frames shown]
[frame 22/68]
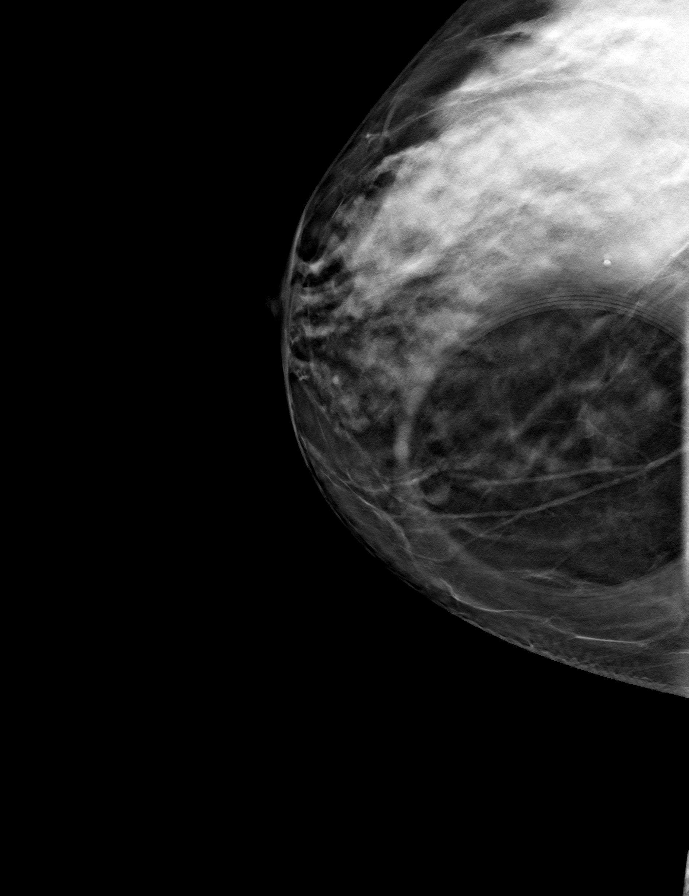
[frame 35/68]
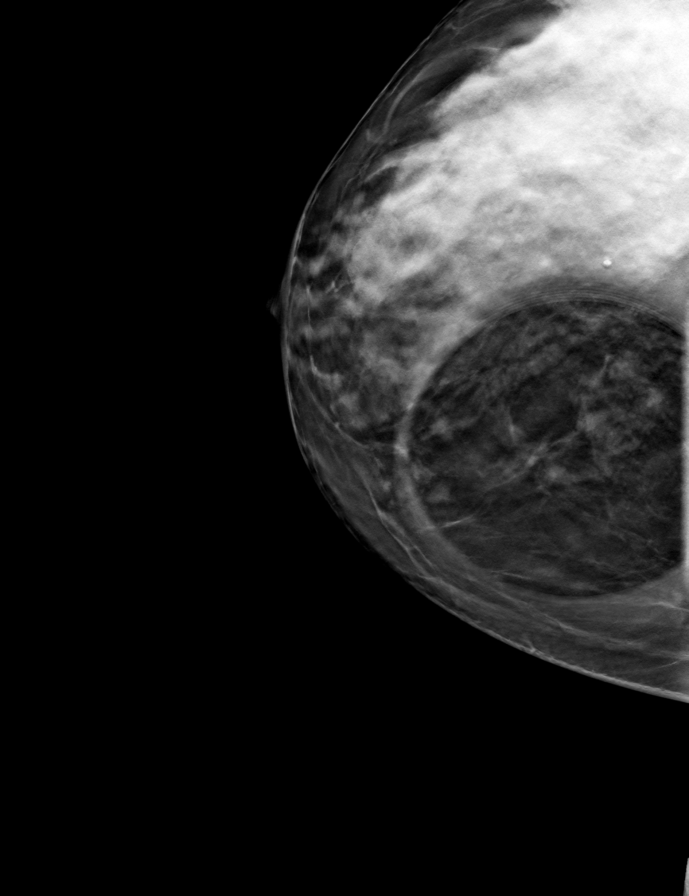

[R MLO tomo · tomo slice 39/76.0]
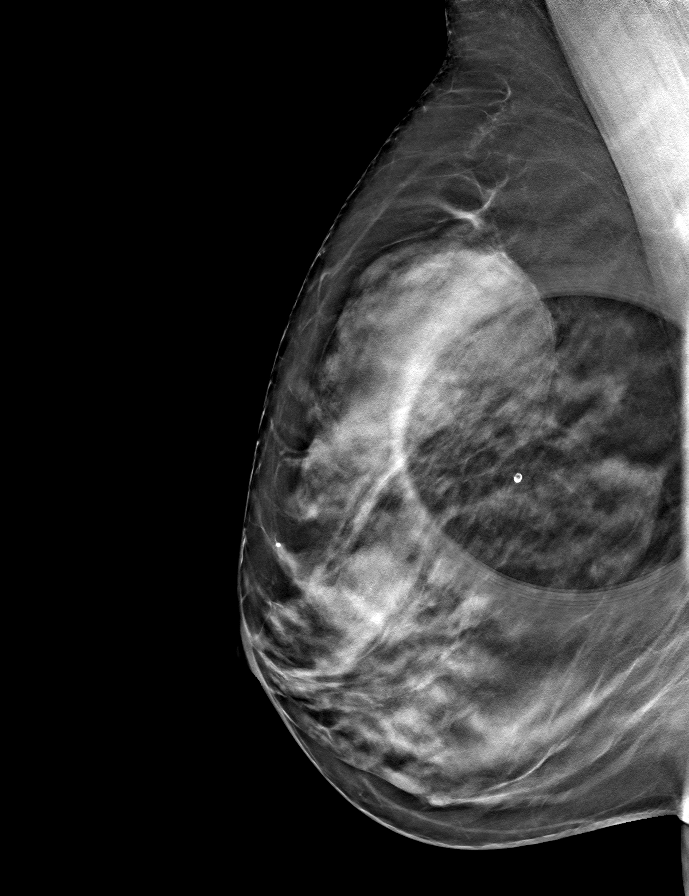

[8 of 15 positions shown; findings below may reference images not displayed]

ACR Breast Density Category c: The breast tissue is heterogeneously
dense, which may obscure small masses.
FINDINGS: Right breast:

Mammogram: Additional 2-D and 3-D images are performed. No
persistent abnormality is identified in the UPPER INNER QUADRANT of
the RIGHT breast. Mammographic images were processed with CAD.

Physical Exam: I palpate no abnormality in the MEDIAL aspect of the
RIGHT breast.

Ultrasound: Targeted ultrasound is performed, showing normal
appearing, extremely dense fibroglandular tissue throughout the
MEDIAL quadrants of the RIGHT breast. No suspicious mass,
distortion, or acoustic shadowing is demonstrated with ultrasound.

Left breast:

Mammogram: Magnified views are performed of calcifications in the
LOWER OUTER QUADRANT of the LEFT breast. On magnified views,
calcifications demonstrate layering and are consistent with benign
milk of calcium. No suspicious morphology or distribution of
calcifications identified. Mammographic images were processed with
CAD.
IMPRESSION: 1. No persistent abnormality in the RIGHT breast.
2. Benign LEFT breast calcifications.

RECOMMENDATION:
Screening mammogram in one year.(Code:[id number])

I have discussed the findings and recommendations with the patient.
Results were also provided in writing at the conclusion of the
visit. If applicable, a reminder letter will be sent to the patient
regarding the next appointment.

BI-RADS CATEGORY  2: Benign.

## 2020-11-14 ENCOUNTER — Ambulatory Visit: Payer: Federal, State, Local not specified - PPO | Attending: Internal Medicine

## 2020-11-14 DIAGNOSIS — Z23 Encounter for immunization: Secondary | ICD-10-CM

## 2020-11-14 NOTE — Progress Notes (Signed)
   Covid-19 Vaccination Clinic  Name:  Melinda [name]    MRN: [id number] DOB: [birth year]  11/14/2020  [name] was observed post Covid-19 immunization for 15 minutes without incident. She was provided with Vaccine Information Sheet and instruction to access the V-Safe system.   [name] was instructed to call 911 with any severe reactions post vaccine: Marland Kitchen Difficulty breathing  . Swelling of face and throat  . A fast heartbeat  . A bad rash all over body  . Dizziness and weakness   Immunizations Administered    Name Date Dose VIS Date Route   PFIZER Comrnaty(Gray TOP) Covid-19 Vaccine 11/14/2020  3:24 PM 0.3 mL 09/19/2020 Intramuscular   Manufacturer: [employer], [employer]   Lot: [id number]   NDC: [phone]

## 2020-11-18 ENCOUNTER — Other Ambulatory Visit: Payer: Self-pay | Admitting: Nurse Practitioner

## 2020-11-18 DIAGNOSIS — Z8639 Personal history of other endocrine, nutritional and metabolic disease: Secondary | ICD-10-CM

## 2020-11-29 ENCOUNTER — Other Ambulatory Visit: Payer: Federal, State, Local not specified - PPO

## 2020-12-09 ENCOUNTER — Ambulatory Visit
Admission: RE | Admit: 2020-12-09 | Discharge: 2020-12-09 | Disposition: A | Discharge status: Home / Self Care | Payer: Federal, State, Local not specified - PPO | Source: Ambulatory Visit | Attending: Nurse Practitioner | Admitting: Nurse Practitioner

## 2020-12-09 DIAGNOSIS — Z8639 Personal history of other endocrine, nutritional and metabolic disease: Secondary | ICD-10-CM

## 2021-06-02 IMAGING — US US THYROID
1 series · 14 of 25 positions shown · non-contrast
Comparison: 12/25/2015

CLINICAL DATA: Thyroid nodule

Increase asymmetry of right thyroid lobe
EXAM:
THYROID ULTRASOUND
TECHNIQUE: Ultrasound examination of the thyroid gland and adjacent soft
tissues was performed.

[Series 1: us thyroid · 0.06mm/px · 14 of 38 slices shown]
[im 1/38]
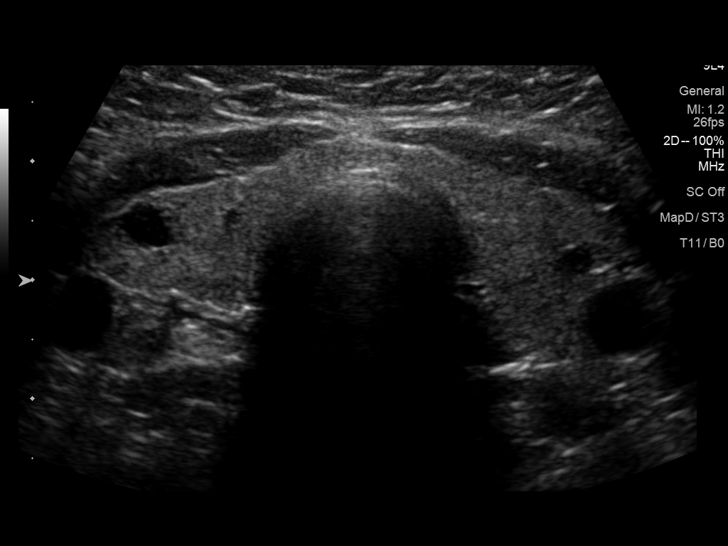
[im 4/38]
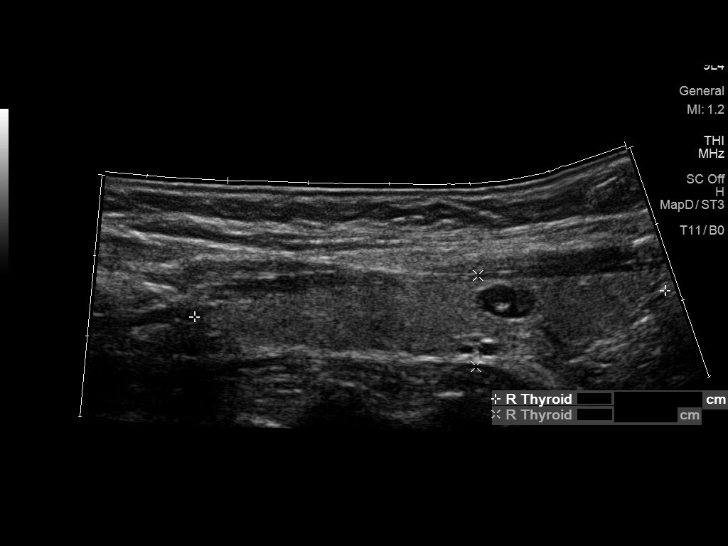
[im 7/38]
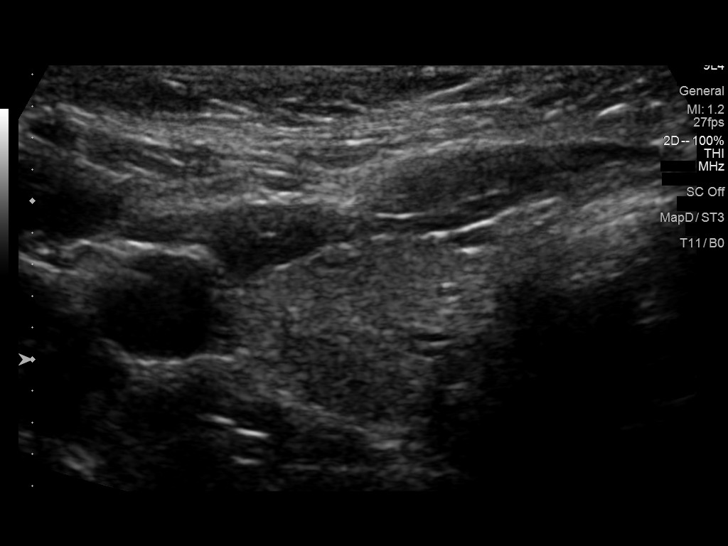
[im 10/38]
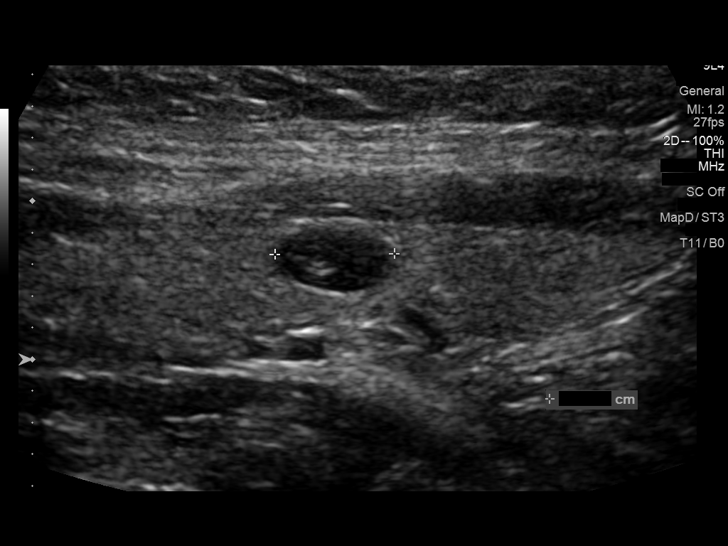
[im 13/38]
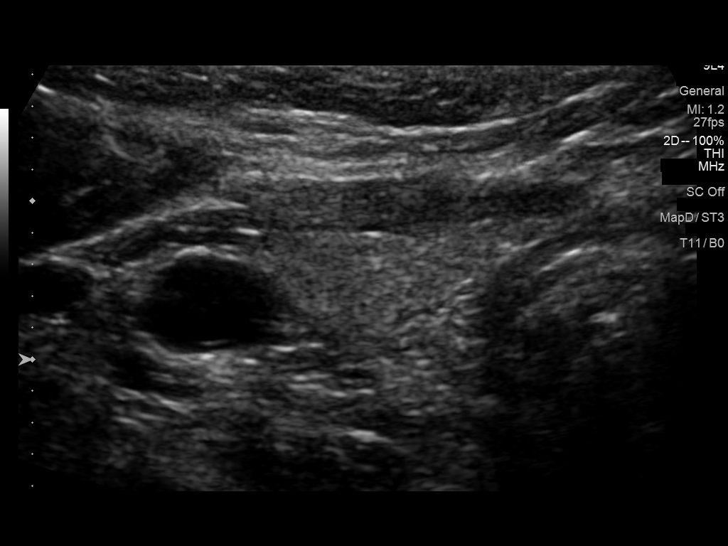
[im 14/38]
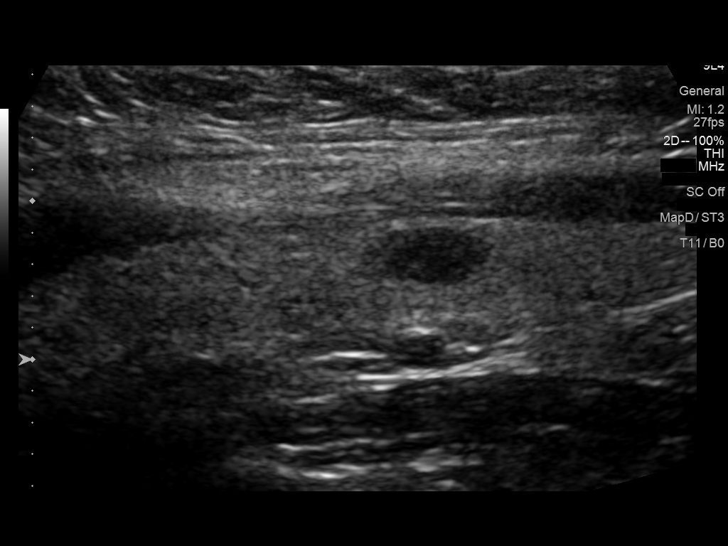
[im 17/38]
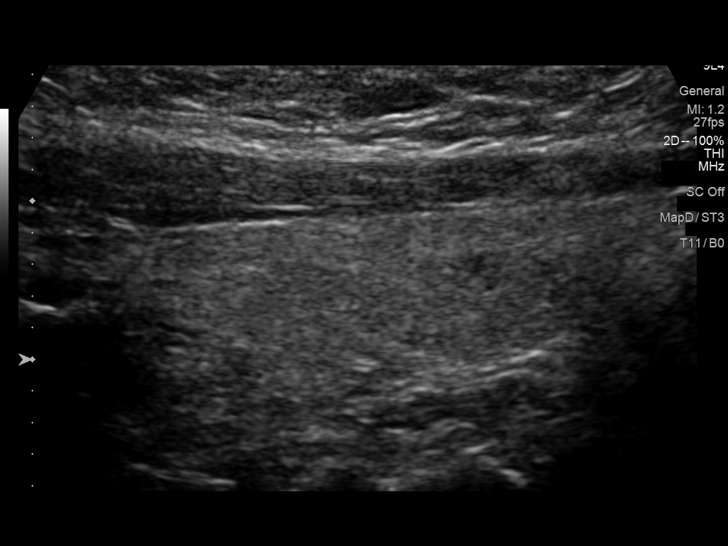
[im 21/38]
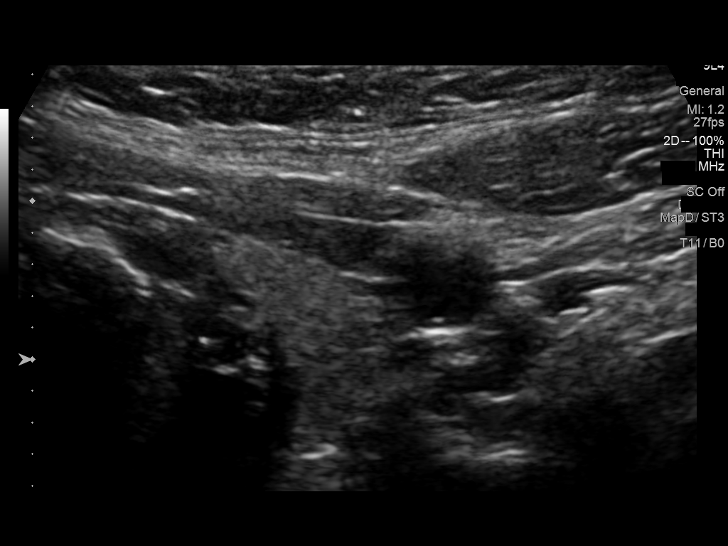
[im 24/38]
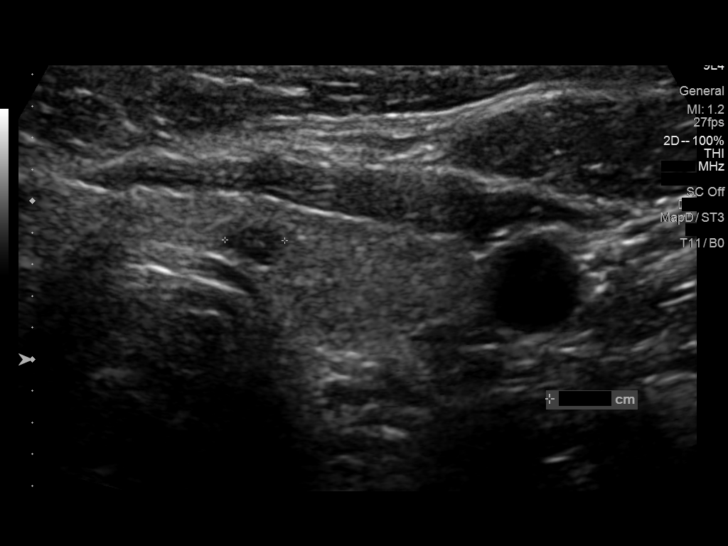
[im 25/38]
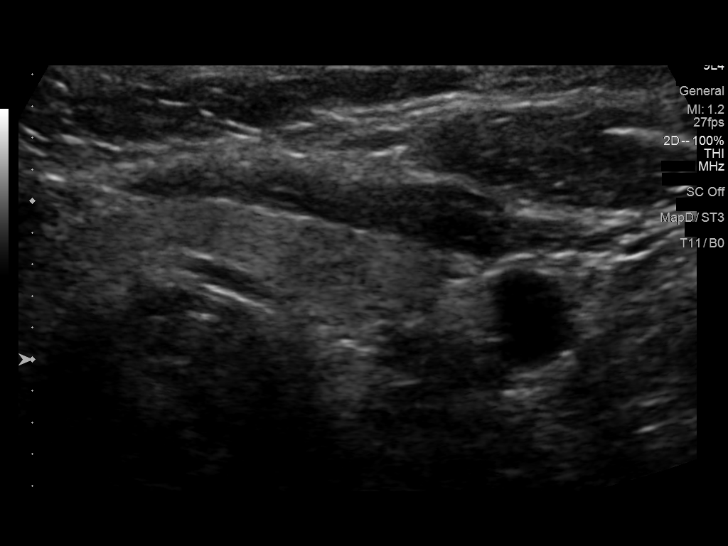
[im 28/38]
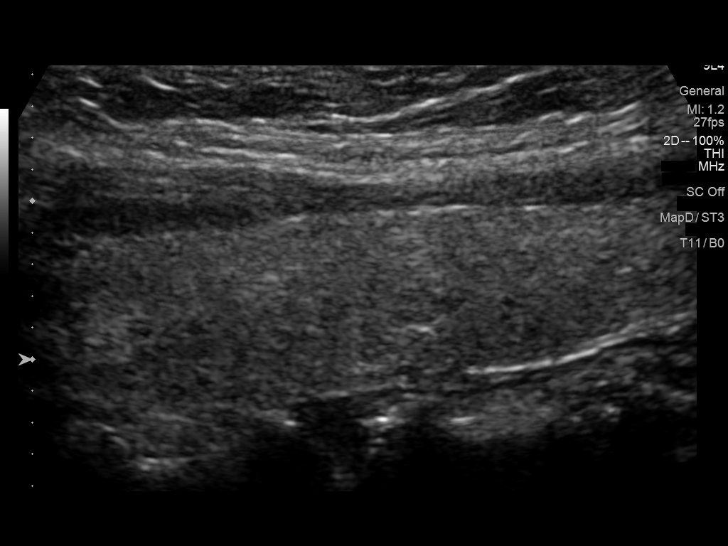
[im 31/38]
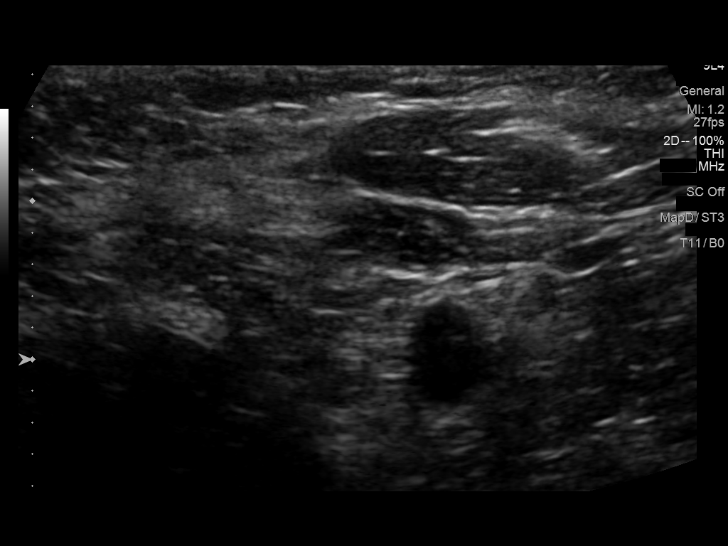
[im 34/38]
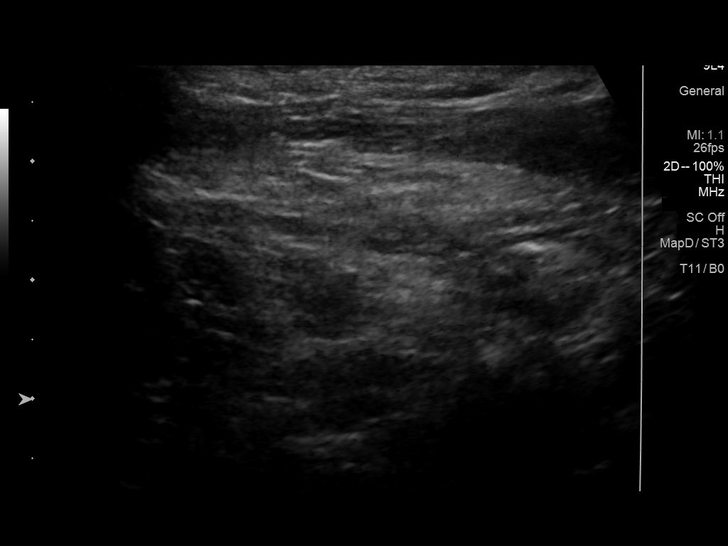
[im 38/38]
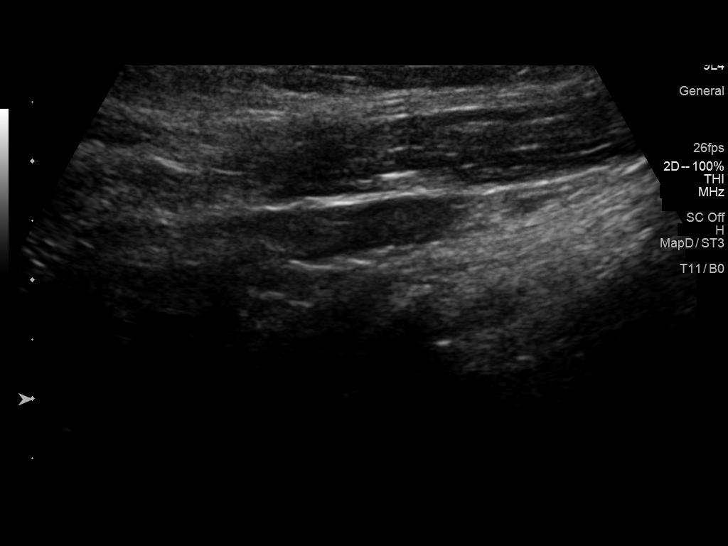

[14 of 25 positions shown; findings below may reference images not displayed]

FINDINGS: Parenchymal Echotexture: Mildly heterogeneous

Isthmus: 0.3 cm

Right lobe: 5.8 x 1.1 x 1.6 cm

Left lobe: 4.9 x 1.1 x 1.5 cm

_________________________________________________________

Estimated total number of nodules >/= 1 cm: 0

Number of spongiform nodules >/=  2 cm not described below (TR1): 0

Number of mixed cystic and solid nodules >/= 1.5 cm not described
below (TR2): 0

_________________________________________________________

8 mm cystic right mid thyroid nodule is not significantly changed in
size since prior examination and does not meet criteria for FNA or
imaging follow-up. Additional less than 5 mm left thyroid nodules do
not meet criteria for FNA or imaging follow-up.
IMPRESSION: No suspicious thyroid nodules.

The above is in keeping with the ACR TI-RADS recommendations - [HOSPITAL] 6949;[DATE].

## 2021-07-01 ENCOUNTER — Encounter: Payer: Self-pay | Admitting: Plastic Surgery

## 2021-07-01 ENCOUNTER — Other Ambulatory Visit: Payer: Self-pay

## 2021-07-01 ENCOUNTER — Ambulatory Visit (INDEPENDENT_AMBULATORY_CARE_PROVIDER_SITE_OTHER): Payer: Self-pay | Admitting: Plastic Surgery

## 2021-07-01 DIAGNOSIS — Z719 Counseling, unspecified: Secondary | ICD-10-CM | POA: Insufficient documentation

## 2021-07-01 NOTE — Progress Notes (Signed)
     Patient ID: Melinda [name], female    DOB: [birth year], 50 y.o.   MRN: [id number]   Chief Complaint  Patient presents with   consult    The patient is a 50 year old female here for evaluation of her abdomen and flanks.  She is 5 feet 8 inches tall and weighs 149 pounds.  She would like to drop another 10 pounds.  She had cholecystectomy in 2016 and did well.  She is got 2 kids.  She would like a little better shape to her abdomen with removal of some of the excess fat.  She is not interested in abdominoplasty and probably would do very well with the liposuction.  She also does not like her neck which seems to be a combination of a little bit of skin laxity and excess fat.  She is not a smoker and does not have diabetes she is otherwise in good health.   Review of Systems  Constitutional: Negative.   HENT: Negative.    Eyes: Negative.   Respiratory: Negative.    Cardiovascular: Negative.   Gastrointestinal: Negative.   Endocrine: Negative.   Genitourinary: Negative.   Musculoskeletal: Negative.   Skin: Negative.  Negative for color change and wound.  Hematological: Negative.   Psychiatric/Behavioral: Negative.     Past Medical History:  Diagnosis Date   Acid reflux    Preeclampsia     History reviewed. No pertinent surgical history.    Current Outpatient Medications:    amLODipine (NORVASC) 5 MG tablet, Take by mouth., Disp: , Rfl:    buPROPion (WELLBUTRIN XL) 150 MG 24 hr tablet, Take 1 tablet by mouth every morning., Disp: , Rfl:    escitalopram (LEXAPRO) 10 MG tablet, Take 10 mg by mouth daily., Disp: , Rfl:    fluticasone (FLONASE) 50 MCG/ACT nasal spray, Place 1 spray into both nostrils daily., Disp: , Rfl:    progesterone (PROMETRIUM) 100 MG capsule, 1/2 tab, Disp: , Rfl:    ondansetron (ZOFRAN ODT) 4 MG disintegrating tablet, Take 1 tablet (4 mg total) by mouth every 8 (eight) hours as needed for nausea or vomiting., Disp: 9 tablet, Rfl: 0   pantoprazole (PROTONIX) 20 MG  tablet, Take 20 mg by mouth daily., Disp: , Rfl:    Objective:   Vitals:   07/01/21 1439  BP: (!) 155/93  Pulse: 88  SpO2: 100%    Physical Exam Vitals and nursing note reviewed.  Constitutional:      Appearance: Normal appearance.  Cardiovascular:     Rate and Rhythm: Normal rate.     Pulses: Normal pulses.  Pulmonary:     Effort: Pulmonary effort is normal.  Abdominal:     General: Abdomen is flat.  Skin:    General: Skin is warm.     Capillary Refill: Capillary refill takes less than 2 seconds.  Neurological:     General: No focal deficit present.     Mental Status: She is alert.  Psychiatric:        Mood and Affect: Mood normal.        Behavior: Behavior normal.    Assessment & Plan:  Encounter for counseling  The patient is a good candidate for liposuction of the abdomen, flanks and neck.  She is also a very good candidate for the Halo.  We will send her quotes.  Pictures were obtained of the patient and placed in the chart with the patient's or guardian's permission.  Alena Bills Cassell Voorhies, DO

## 2021-09-10 DIAGNOSIS — Z719 Counseling, unspecified: Secondary | ICD-10-CM

## 2021-10-14 ENCOUNTER — Encounter: Payer: Federal, State, Local not specified - PPO | Admitting: Surgical

## 2021-10-17 ENCOUNTER — Ambulatory Visit: Payer: Self-pay | Admitting: Surgical

## 2021-10-17 ENCOUNTER — Other Ambulatory Visit: Payer: Self-pay

## 2021-10-17 ENCOUNTER — Encounter: Payer: Self-pay | Admitting: Surgical

## 2021-10-17 DIAGNOSIS — Z719 Counseling, unspecified: Secondary | ICD-10-CM

## 2021-10-17 NOTE — Progress Notes (Unsigned)
Patient ID: Melinda [name], female    DOB: 01/22/1971, 51 y.o.   MRN: [id number]  No chief complaint on file.     ICD-10-CM   1. Encounter for counseling  Z71.9        History of Present Illness: Melinda [name] is a 51 y.o.  female.  She presents for preoperative evaluation for upcoming procedure, Liposuction of abdomen, flanks and neck, scheduled for 11/03/2021 with Dr. Ulice Bold.  The patient {HAS HAS VPX:10626} had problems with anesthesia. ***  Summary of Previous Visit: She is not a smoker and does not have diabetes.  She is otherwise in good health.  Job: ***  PMH Significant for: Hypertension   Past Medical History: Allergies: Allergies  Allergen Reactions   Penicillins Nausea And Vomiting    Current Medications:  Current Outpatient Medications:    amLODipine (NORVASC) 5 MG tablet, Take by mouth., Disp: , Rfl:    buPROPion (WELLBUTRIN XL) 150 MG 24 hr tablet, Take 1 tablet by mouth every morning., Disp: , Rfl:    escitalopram (LEXAPRO) 10 MG tablet, Take 10 mg by mouth daily., Disp: , Rfl:    fluticasone (FLONASE) 50 MCG/ACT nasal spray, Place 1 spray into both nostrils daily., Disp: , Rfl:    ondansetron (ZOFRAN ODT) 4 MG disintegrating tablet, Take 1 tablet (4 mg total) by mouth every 8 (eight) hours as needed for nausea or vomiting., Disp: 9 tablet, Rfl: 0   pantoprazole (PROTONIX) 20 MG tablet, Take 20 mg by mouth daily., Disp: , Rfl:    progesterone (PROMETRIUM) 100 MG capsule, 1/2 tab, Disp: , Rfl:   Past Medical Problems: Past Medical History:  Diagnosis Date   Acid reflux    Preeclampsia     Past Surgical History: History reviewed. No pertinent surgical history.  Social History: Social History   Socioeconomic History   Marital status: Married    Spouse name: Not on file   Number of children: Not on file   Years of education: Not on file   Highest education level: Not on file  Occupational History   Not on file  Tobacco Use   Smoking status:  Never   Smokeless tobacco: Never  Substance and Sexual Activity   Alcohol use: Yes    Comment: occ   Drug use: No   Sexual activity: Not on file  Other Topics Concern   Not on file  Social History Narrative   Not on file   Social Determinants of Health   Financial Resource Strain: Not on file  Food Insecurity: Not on file  Transportation Needs: Not on file  Physical Activity: Not on file  Stress: Not on file  Social Connections: Not on file  Intimate Partner Violence: Not on file    Family History: History reviewed. No pertinent family history.  Review of Systems: ROS  Physical Exam: Vital Signs There were no vitals taken for this visit.  Physical Exam *** Constitutional:      General: Not in acute distress.    Appearance: Normal appearance. Not ill-appearing.  HENT:     Head: Normocephalic and atraumatic.  Eyes:     Pupils: Pupils are equal, round Neck:     Musculoskeletal: Normal range of motion.  Cardiovascular:     Rate and Rhythm: Normal rate    Pulses: Normal pulses.  Pulmonary:     Effort: Pulmonary effort is normal. No respiratory distress.  Abdominal:     General: Abdomen is flat. There is no distension.  Musculoskeletal: Normal range of motion.  Skin:    General: Skin is warm and dry.     Findings: No erythema or rash.  Neurological:     General: No focal deficit present.     Mental Status: Alert and oriented to person, place, and time. Mental status is at baseline.     Motor: No weakness.  Psychiatric:        Mood and Affect: Mood normal.        Behavior: Behavior normal.    Assessment/Plan: The patient is scheduled for liposuction of abdomen, flanks and neck with Dr. Ulice Bold.  Risks, benefits, and alternatives of procedure discussed, questions answered and consent obtained.    Smoking Status: ***; Counseling Given? *** Last Mammogram: ***; Results: ***  Caprini Score: ***; Risk Factors include: ***, BMI *** 25, and length of planned  surgery. Recommendation for mechanical *** prophylaxis. Encourage early ambulation.   Pictures obtained: @consult   Post-op Rx sent to pharmacy: {Blank:19197::"Norco, Zofran, Keflex","Norco, Zofran"}  Patient was provided with the General Surgical Risk consent document and Pain Medication Agreement prior to their appointment.  They had adequate time to read through the risk consent documents and Pain Medication Agreement. We also discussed them in person together during this preop appointment. All of their questions were answered to their satisfaction.  Recommended calling if they have any further questions.  Risk consent form and Pain Medication Agreement to be scanned into patient's chart.  The risks that can be encountered with and after liposuction were discussed and include the following but no limited to these:  Asymmetry, fluid accumulation, firmness of the area, fat necrosis with death of fat tissue, bleeding, infection, delayed healing, anesthesia risks, skin sensation changes, injury to structures including nerves, blood vessels, and muscles which may be temporary or permanent, allergies to tape, suture materials and glues, blood products, topical preparations or injected agents, skin and contour irregularities, skin discoloration and swelling, deep vein thrombosis, cardiac and pulmonary complications, pain, which may persist, persistent pain, recurrence of the lesion, poor healing of the incision, possible need for revisional surgery or staged procedures. Thiere can also be persistent swelling, poor wound healing, rippling or loose skin, worsening of cellulite, swelling, and thermal burn or heat injury from ultrasound with the ultrasound-assisted lipoplasty technique. Any change in weight fluctuations can alter the outcome.    Electronically signed by: Treasure Ochs, PA-C 10/17/2021 7:54 AM

## 2021-10-28 NOTE — Progress Notes (Signed)
Patient ID: Melinda [name], female    DOB: 08/15/1971, 51 y.o.   MRN: [id number]  Chief Complaint  Patient presents with   Post-op Follow-up      ICD-10-CM   1. Encounter for counseling  Z71.9        History of Present Illness: Melinda [name] is a 51 y.o.  female  with a history of multiparity and prior cholecystectomy with areas of skin laxity and excess fat.  She presents for preoperative evaluation for upcoming procedure, liposuction of abdomen, flanks, and neck, scheduled for 11/03/2021 with Dr. Ulice Boldillingham.  The patient has not had problems with anesthesia. Reports only mild PONV.  She denies any personal or family history of blood clots or clotting disorder.  She has never had cancer.  She does not smoke tobacco or use nicotine products.  She is not a diabetic.  She is healthy and takes medication only for anxiety and perimenopausal symptoms.  She is looking forward to surgery.  She does report that she had an episode of nausea after PCN, but denies any rashes or reactions.  Denies tape allergies.    Summary of Previous Visit: Patient was seen by Dr. Ulice Boldillingham for initial consult 07/01/2021.  At that time, she complained of areas of excess fat and skin.  Discussed surgical intervention with liposuction of the abdomen, flanks, and neck.  She was also deemed a good candidate for halo laser surgery.  Patient has since expressed desire to proceed with surgical intervention by means of liposuction.  PMH Significant for: HTN, anxiety and depression, GERD.   Past Medical History: Allergies: Allergies  Allergen Reactions   Penicillins Nausea And Vomiting    Current Medications:  Current Outpatient Medications:    amLODipine (NORVASC) 5 MG tablet, Take by mouth., Disp: , Rfl:    buPROPion (WELLBUTRIN XL) 150 MG 24 hr tablet, Take 1 tablet by mouth every morning., Disp: , Rfl:    escitalopram (LEXAPRO) 10 MG tablet, Take 10 mg by mouth daily., Disp: , Rfl:    fluticasone (FLONASE) 50  MCG/ACT nasal spray, Place 1 spray into both nostrils daily., Disp: , Rfl:    progesterone (PROMETRIUM) 100 MG capsule, 1/2 tab, Disp: , Rfl:    Testosterone 50 MG PLLT, by Implant route., Disp: , Rfl:    cephALEXin (KEFLEX) 500 MG capsule, Take 1 capsule (500 mg total) by mouth 4 (four) times daily for 3 days., Disp: 12 capsule, Rfl: 0   HYDROcodone-acetaminophen (NORCO) 5-325 MG tablet, Take 1 tablet by mouth every 6 (six) hours as needed for up to 5 days for severe pain., Disp: 20 tablet, Rfl: 0   ondansetron (ZOFRAN ODT) 4 MG disintegrating tablet, Take 1 tablet (4 mg total) by mouth every 8 (eight) hours as needed for nausea or vomiting., Disp: 9 tablet, Rfl: 0   ondansetron (ZOFRAN-ODT) 4 MG disintegrating tablet, Take 1 tablet (4 mg total) by mouth every 8 (eight) hours as needed for nausea or vomiting., Disp: 20 tablet, Rfl: 0   pantoprazole (PROTONIX) 20 MG tablet, Take 20 mg by mouth daily., Disp: , Rfl:   Past Medical Problems: Past Medical History:  Diagnosis Date   Acid reflux    Preeclampsia     Past Surgical History: No past surgical history on file.  Social History: Social History   Socioeconomic History   Marital status: Married    Spouse name: Not on file   Number of children: Not on file   Years of education: Not  on file   Highest education level: Not on file  Occupational History   Not on file  Tobacco Use   Smoking status: Never   Smokeless tobacco: Never  Substance and Sexual Activity   Alcohol use: Yes    Comment: occ   Drug use: No   Sexual activity: Not on file  Other Topics Concern   Not on file  Social History Narrative   Not on file   Social Determinants of Health   Financial Resource Strain: Not on file  Food Insecurity: Not on file  Transportation Needs: Not on file  Physical Activity: Not on file  Stress: Not on file  Social Connections: Not on file  Intimate Partner Violence: Not on file    Family History: No family history on  file.  Review of Systems: ROS Denies recent illness, infection, fevers or chills, chest pain or shortness of breath.  Physical Exam: Vital Signs BP (!) 148/88 (BP Location: Right Arm, Patient Position: Sitting, Cuff Size: Large)    Pulse 97    Ht 5\' 8"  (1.727 m)    Wt 157 lb 8 oz (71.4 kg)    SpO2 100%    BMI 23.95 kg/m   Physical Exam Constitutional:      General: Not in acute distress.    Appearance: Normal appearance. Not ill-appearing.  HENT:     Head: Normocephalic and atraumatic.  Eyes:     Pupils: Pupils are equal, round. Cardiovascular:     Rate and Rhythm: Normal rate.    Pulses: Normal pulses.  Pulmonary:     Effort: No respiratory distress or increased work of breathing.  Speaks in full sentences. Abdominal:     General: Abdomen is flat. No distension.   Musculoskeletal: Normal range of motion. No lower extremity swelling or edema. No varicosities.  Skin:    General: Skin is warm and dry.     Findings: No erythema or rash.  Neurological:     Mental Status: Alert and oriented to person, place, and time.  Psychiatric:        Mood and Affect: Mood normal.        Behavior: Behavior normal.    Assessment/Plan: The patient is scheduled for liposuction of abdomen, flanks, and neck with Dr. .  Risks, benefits, and alternatives of procedure discussed, questions answered and consent obtained.    Smoking Status: Non-smoker.  Caprini Score: 3; Risk Factors include: Age and length of planned surgery. Recommendation for mechanical prophylaxis. Encourage early ambulation.  Her only hormonal medication is progesterone for perimenopausal symptoms.  Pictures obtained: 07/01/2021 (abdomen and flank), today (neck).   Post-op Rx sent to pharmacy: Norco, Zofran, Keflex.  Endorses history of nausea after penicillin antibiotic, but no significant allergy and low cross-reactivity with cephalosporins regardless.  Patient was provided with the General Surgical Risk consent  document and Pain Medication Agreement prior to their appointment.  They had adequate time to read through the risk consent documents and Pain Medication Agreement. We also discussed them in person together during this preop appointment. All of their questions were answered to their satisfaction.  Recommended calling if they have any further questions.  Risk consent form and Pain Medication Agreement to be scanned into patient's chart.  The risks that can be encountered with and after liposuction were discussed and include the following but no limited to these:  Asymmetry, fluid accumulation, firmness of the area, fat necrosis with death of fat tissue, bleeding, infection, delayed healing, anesthesia risks, skin  sensation changes, injury to structures including nerves, blood vessels, and muscles which may be temporary or permanent, allergies to tape, suture materials and glues, blood products, topical preparations or injected agents, skin and contour irregularities, skin discoloration and swelling, deep vein thrombosis, cardiac and pulmonary complications, pain, which may persist, persistent pain, recurrence of the lesion, poor healing of the incision, possible need for revisional surgery or staged procedures. Thiere can also be persistent swelling, poor wound healing, rippling or loose skin, worsening of cellulite, swelling, and thermal burn or heat injury from ultrasound with the ultrasound-assisted lipoplasty technique. Any change in weight fluctuations can alter the outcome.    Electronically signed by: Evelena Leyden, PA-C 10/29/2021 2:32 PM

## 2021-10-29 ENCOUNTER — Other Ambulatory Visit: Payer: Self-pay

## 2021-10-29 ENCOUNTER — Ambulatory Visit: Payer: Self-pay | Admitting: Physician Assistant

## 2021-10-29 ENCOUNTER — Encounter: Payer: Self-pay | Admitting: Physician Assistant

## 2021-10-29 VITALS — BP 148/88 | HR 97 | Ht 68.0 in | Wt 157.5 lb

## 2021-10-29 DIAGNOSIS — Z719 Counseling, unspecified: Secondary | ICD-10-CM

## 2021-10-29 MED ORDER — HYDROCODONE-ACETAMINOPHEN 5-325 MG PO TABS: 1.0000 | ORAL_TABLET | Freq: Four times a day (QID) | ORAL | 0 refills | Status: AC | PRN

## 2021-10-29 MED ORDER — CEPHALEXIN 500 MG PO CAPS: 500.0000 mg | ORAL_CAPSULE | Freq: Four times a day (QID) | ORAL | 0 refills | Status: AC

## 2021-10-29 MED ORDER — ONDANSETRON 4 MG PO TBDP: 4.0000 mg | ORAL_TABLET | Freq: Three times a day (TID) | ORAL | 0 refills | Status: AC | PRN

## 2021-11-03 ENCOUNTER — Telehealth: Payer: Self-pay | Admitting: Plastic Surgery

## 2021-11-05 ENCOUNTER — Telehealth: Payer: Self-pay

## 2021-11-05 NOTE — Telephone Encounter (Signed)
Patient called to say she had surgery on Monday and would like to know if it's ok for her to take the wrap off.  Also, she asked if she can take a quick shower or take off the wrap and re-adjust it.  Patient asked if she should freakout with what she sees.  Please call.

## 2021-11-05 NOTE — Telephone Encounter (Signed)
Called and spoke with the patient regarding the message below.  Informed the patient she can go ahead and take a shower.  Informed her that I spoke with [facility] and he stated that she can take the wrap off that's around her chin and neck to take a shower, but she to keep the sticky dressing on.  After she takes a shower she's to place the wrap back on her neck and chin, and around her abdomen,flanks.    Patient verbalized understanding and agreed.//AB/CMA

## 2021-11-14 ENCOUNTER — Other Ambulatory Visit: Payer: Self-pay

## 2021-11-14 ENCOUNTER — Ambulatory Visit (INDEPENDENT_AMBULATORY_CARE_PROVIDER_SITE_OTHER): Payer: Self-pay | Admitting: Plastic Surgery

## 2021-11-14 DIAGNOSIS — Z719 Counseling, unspecified: Secondary | ICD-10-CM

## 2021-11-14 NOTE — Progress Notes (Signed)
The patient is a 51 year old female here for follow-up after undergoing abdominal and chin liposuction.  She has quite a bit of swelling and bruising in the abdominal area and a little bit of swelling in the chin.  Overall she is doing really well.  I do not see any signs of hematoma or seroma.  No signs of infection.  She should start massage of the area in the next week and continue with spanks.  Follow-up in 2 to 3 weeks.

## 2021-11-24 NOTE — Telephone Encounter (Signed)
Nothing to add

## 2021-12-02 ENCOUNTER — Ambulatory Visit (INDEPENDENT_AMBULATORY_CARE_PROVIDER_SITE_OTHER): Payer: Self-pay | Admitting: Plastic Surgery

## 2021-12-02 ENCOUNTER — Other Ambulatory Visit: Payer: Self-pay

## 2021-12-02 ENCOUNTER — Encounter: Payer: Self-pay | Admitting: Plastic Surgery

## 2021-12-02 DIAGNOSIS — Z719 Counseling, unspecified: Secondary | ICD-10-CM

## 2021-12-02 NOTE — Progress Notes (Signed)
° °  Subjective:    Patient ID: Melinda [name], female    DOB: 31-Jul-1971, 51 y.o.   MRN: [id number]  Patient is a 51 year old female here for follow-up after undergoing liposuction of her abdomen and flanks.  She is 3 weeks out.  She is extremely pleased.  All areas are getting softer.  Her bruising and swelling has improved.  Her pain is nearly gone.     Review of Systems  Constitutional: Negative.   Eyes: Negative.   Respiratory: Negative.    Cardiovascular: Negative.   Gastrointestinal: Negative.   Endocrine: Negative.   Genitourinary: Negative.   Musculoskeletal: Negative.   Neurological: Negative.   Hematological: Negative.       Objective:   Physical Exam Constitutional:      Appearance: Normal appearance.  Cardiovascular:     Rate and Rhythm: Normal rate.     Pulses: Normal pulses.  Skin:    General: Skin is warm.     Capillary Refill: Capillary refill takes less than 2 seconds.  Neurological:     Mental Status: She is alert and oriented to person, place, and time.  Psychiatric:        Mood and Affect: Mood normal.        Behavior: Behavior normal.        Thought Content: Thought content normal.        Judgment: Judgment normal.       Assessment & Plan:     ICD-10-CM   1. Encounter for counseling  Z71.9       Continue massage to the areas that are firm.  Continue with spanks. Pictures were obtained of the patient and placed in the chart with the patient's or guardian's permission.

## 2021-12-03 ENCOUNTER — Encounter: Payer: Self-pay | Admitting: Plastic Surgery

## 2022-01-06 ENCOUNTER — Telehealth: Payer: Federal, State, Local not specified - PPO | Admitting: Plastic Surgery

## 2024-11-17 ENCOUNTER — Emergency Department (HOSPITAL_BASED_OUTPATIENT_CLINIC_OR_DEPARTMENT_OTHER): Admission: EM | Admit: 2024-11-17 | Source: Home / Self Care

## 2024-11-17 ENCOUNTER — Encounter (HOSPITAL_BASED_OUTPATIENT_CLINIC_OR_DEPARTMENT_OTHER): Payer: Self-pay

## 2024-11-17 ENCOUNTER — Other Ambulatory Visit: Payer: Self-pay

## 2024-11-17 MED ORDER — FLUORESCEIN SODIUM 1 MG OP STRP
1.0000 | ORAL_STRIP | Freq: Once | OPHTHALMIC | Status: AC
  Filled 2024-11-17: qty 1

## 2024-11-17 MED ORDER — TETRACAINE HCL 0.5 % OP SOLN
2.0000 [drp] | Freq: Once | OPHTHALMIC | Status: AC
  Filled 2024-11-17: qty 4

## 2024-11-17 NOTE — ED Triage Notes (Signed)
 Pt reports being z-pack for x4 days and reports noticing R pupil is slightly larger and left. Pt denies any vision problems. Pt denies any HA. Pt reports also using Afrin for the past few nights.

## 2024-11-17 NOTE — ED Provider Notes (Incomplete)
 " Melinda [name]   Melinda [name]: [id number] Arrival date & time: 11/17/24  8144     Patient presents with: [facility] Strayer is a 54 y.o. female.  {Add pertinent medical, surgical, social history, OB history to HPI:32947} 54 yo F with a chief complaints of her right pupil being much larger than the left 1.  She noticed this today.  She has been suffering from what she thinks is an upper respiratory illness for about 3 weeks now.  She had a virtual visit and was prescribed azithromycin.  She noticed today when she looked in the mirror that her right pupil was bigger than her left.  Her husband is an ear nose and throat physician.  He is out of town but when she had talked with him he was concerned that maybe she had had a stroke and wanted her to be evaluated.  She has some sinus pressure but denies headache denies confusion denies one-sided numbness or weakness denies difficulty speech or swallowing.  She denies eye pain.  Denies change to her vision.   Eye Problem      Prior to Admission medications  Medication Sig Start Date End Date Taking? Authorizing Provider  amLODipine (NORVASC) 5 MG tablet Take by mouth. 05/13/21   [provider]  buPROPion (WELLBUTRIN XL) 150 MG 24 hr tablet Take 1 tablet by mouth every morning. 12/18/20   [provider]  escitalopram (LEXAPRO) 10 MG tablet Take 10 mg by mouth daily.    [provider]  fluticasone (FLONASE) 50 MCG/ACT nasal spray Place 1 spray into both nostrils daily.    [provider]  ondansetron  (ZOFRAN -ODT) 4 MG disintegrating tablet Take 1 tablet (4 mg total) by mouth every 8 (eight) hours as needed for nausea or vomiting. 10/29/21   Landy Honora CROME, PA-C  Testosterone 50 MG PLLT by Implant route.    [provider]    Allergies: Penicillins    Review of Systems  Updated Vital Signs BP (!) 184/98   Pulse (!) 109   Temp 98.2 F (36.8  C)   Resp 20   Ht 5' 8 (1.727 m)   Wt 65.8 kg   SpO2 100%   BMI 22.05 kg/m   Physical Exam Vitals and nursing [name] reviewed.  Constitutional:      General: She is not in acute distress.    Appearance: She is well-developed. She is not diaphoretic.  HENT:     Head: Normocephalic and atraumatic.     Comments: Swollen turbinates posterior nasal drip.  TMs are normal bilaterally Eyes:     Comments: Perhaps the right pupil is 1 mm larger than the left.  Otherwise extraocular motion is intact pupils are reactive to light and accommodation.  Cardiovascular:     Rate and Rhythm: Normal rate and regular rhythm.     Heart sounds: No murmur heard.    No friction rub. No gallop.  Pulmonary:     Effort: Pulmonary effort is normal.     Breath sounds: No wheezing or rales.  Abdominal:     General: There is no distension.     Palpations: Abdomen is soft.     Tenderness: There is no abdominal tenderness.  Musculoskeletal:        General: No tenderness.     Cervical back: Normal range of motion and neck supple.  Skin:    General: Skin is warm and dry.  Neurological:  Mental Status: She is alert and oriented to person, place, and time.  Psychiatric:        Behavior: Behavior normal.     (all labs ordered are listed, but only abnormal results are displayed) Labs Reviewed  CBC WITH DIFFERENTIAL/PLATELET  BASIC METABOLIC PANEL WITH GFR    EKG: None  Radiology: No results found.  {Document cardiac monitor, telemetry assessment procedure when appropriate:32947} Procedures   Medications Ordered in the ED  tetracaine  (PONTOCAINE) 0.5 % ophthalmic solution 2 drop (has no administration in time range)  fluorescein  ophthalmic strip 1 strip (has no administration in time range)      {Click here for ABCD2, HEART and other calculators REFRESH [name] before signing:1}                              Medical Decision Making Amount and/or Complexity of Data Reviewed Labs:  ordered. Radiology: ordered.  Risk Prescription drug management.   54 yo F with a chief complaints of noticing that her right pupil was larger than the left.  She just noticed this today.  She has been suffering from a sinus infection.  It seems less likely the patient has a intracranial abscess.  Seems unlikely that she has had a stroke.  Has no visual symptoms.  No neurologic deficit otherwise.  She has no headache.  She does use topical eyedrops daily.  She denies any recent change to them.  Will obtain a CT angiogram head and neck and a CT venogram.  {Document critical care time when appropriate  Document review of labs and clinical decision tools ie CHADS2VASC2, etc  Document your independent review of radiology images and any outside records  Document your discussion with family members, caretakers and with consultants  Document social determinants of health affecting pt's care  Document your decision making why or why not admission, treatments were needed:32947:::1}   Final diagnoses:  None    ED Discharge Orders     None        "
